# Patient Record
Sex: Male | Born: 1960 | Race: White | Hispanic: No | Marital: Married | Smoking: Current every day smoker
Health system: Southern US, Community
[De-identification: ages and names within clinical notes are randomized; demographics above are authoritative.]

## PROBLEM LIST (undated history)

## (undated) DIAGNOSIS — Z972 Presence of dental prosthetic device (complete) (partial): Secondary | ICD-10-CM

## (undated) DIAGNOSIS — M1990 Unspecified osteoarthritis, unspecified site: Secondary | ICD-10-CM

## (undated) DIAGNOSIS — S42309A Unspecified fracture of shaft of humerus, unspecified arm, initial encounter for closed fracture: Secondary | ICD-10-CM

## (undated) HISTORY — PX: MULTIPLE TOOTH EXTRACTIONS: SHX2053

## (undated) HISTORY — PX: HIP SURGERY: SHX245

---

## 2000-07-13 ENCOUNTER — Encounter: Payer: Self-pay | Admitting: Family Medicine

## 2000-07-13 ENCOUNTER — Encounter: Admission: RE | Admit: 2000-07-13 | Discharge: 2000-07-13 | Payer: Self-pay | Admitting: Family Medicine

## 2002-07-23 ENCOUNTER — Emergency Department (HOSPITAL_COMMUNITY): Admission: EM | Admit: 2002-07-23 | Discharge: 2002-07-23 | Payer: Self-pay

## 2010-09-22 ENCOUNTER — Emergency Department (HOSPITAL_COMMUNITY): Payer: Self-pay

## 2010-09-22 ENCOUNTER — Emergency Department (HOSPITAL_COMMUNITY)
Admission: EM | Admit: 2010-09-22 | Discharge: 2010-09-22 | Disposition: A | Discharge status: Home / Self Care | Payer: Self-pay | Attending: Emergency Medicine | Admitting: Emergency Medicine

## 2010-09-22 DIAGNOSIS — M79609 Pain in unspecified limb: Secondary | ICD-10-CM | POA: Insufficient documentation

## 2010-09-22 DIAGNOSIS — R0789 Other chest pain: Secondary | ICD-10-CM | POA: Insufficient documentation

## 2010-09-22 DIAGNOSIS — F17200 Nicotine dependence, unspecified, uncomplicated: Secondary | ICD-10-CM | POA: Insufficient documentation

## 2010-09-22 DIAGNOSIS — R0602 Shortness of breath: Secondary | ICD-10-CM | POA: Insufficient documentation

## 2010-09-22 LAB — POCT CARDIAC MARKERS
CKMB, poc: 1.2 ng/mL (ref 1.0–8.0)
Myoglobin, poc: 67.5 ng/mL (ref 12–200)

## 2010-09-22 LAB — POCT I-STAT, CHEM 8
BUN: 13 mg/dL (ref 6–23)
Calcium, Ion: 1.13 mmol/L (ref 1.12–1.32)
Chloride: 105 mEq/L (ref 96–112)
Creatinine, Ser: 1.1 mg/dL (ref 0.4–1.5)
Glucose, Bld: 118 mg/dL — ABNORMAL HIGH (ref 70–99)
HCT: 40 % (ref 39.0–52.0)
Hemoglobin: 13.6 g/dL (ref 13.0–17.0)
Potassium: 3.9 mEq/L (ref 3.5–5.1)
Sodium: 140 mEq/L (ref 135–145)
TCO2: 24 mmol/L (ref 0–100)

## 2010-09-22 LAB — DIFFERENTIAL
Basophils Absolute: 0 10*3/uL (ref 0.0–0.1)
Basophils Relative: 0 % (ref 0–1)
Eosinophils Absolute: 0.2 10*3/uL (ref 0.0–0.7)
Eosinophils Relative: 3 % (ref 0–5)
Lymphocytes Relative: 29 % (ref 12–46)
Lymphs Abs: 2.4 10*3/uL (ref 0.7–4.0)
Monocytes Absolute: 0.6 10*3/uL (ref 0.1–1.0)
Monocytes Relative: 7 % (ref 3–12)
Neutro Abs: 4.9 10*3/uL (ref 1.7–7.7)
Neutrophils Relative %: 61 % (ref 43–77)

## 2010-09-22 LAB — CBC
HCT: 38.1 % — ABNORMAL LOW (ref 39.0–52.0)
Hemoglobin: 13.4 g/dL (ref 13.0–17.0)
MCH: 31.5 pg (ref 26.0–34.0)
MCHC: 35.2 g/dL (ref 30.0–36.0)
MCV: 89.4 fL (ref 78.0–100.0)
Platelets: 274 10*3/uL (ref 150–400)
RBC: 4.26 MIL/uL (ref 4.22–5.81)
RDW: 13.3 % (ref 11.5–15.5)
WBC: 8.1 10*3/uL (ref 4.0–10.5)

## 2012-12-24 ENCOUNTER — Encounter (HOSPITAL_COMMUNITY): Payer: Self-pay | Admitting: Emergency Medicine

## 2012-12-24 ENCOUNTER — Emergency Department (HOSPITAL_COMMUNITY)
Admission: EM | Admit: 2012-12-24 | Discharge: 2012-12-24 | Disposition: A | Discharge status: Home / Self Care | Payer: Self-pay | Attending: Emergency Medicine | Admitting: Emergency Medicine

## 2012-12-24 DIAGNOSIS — F1410 Cocaine abuse, uncomplicated: Secondary | ICD-10-CM | POA: Insufficient documentation

## 2012-12-24 DIAGNOSIS — F1010 Alcohol abuse, uncomplicated: Secondary | ICD-10-CM | POA: Insufficient documentation

## 2012-12-24 DIAGNOSIS — F17200 Nicotine dependence, unspecified, uncomplicated: Secondary | ICD-10-CM | POA: Insufficient documentation

## 2012-12-24 LAB — CBC WITH DIFFERENTIAL/PLATELET
Basophils Absolute: 0 10*3/uL (ref 0.0–0.1)
Basophils Relative: 0 % (ref 0–1)
Eosinophils Absolute: 0.1 10*3/uL (ref 0.0–0.7)
Eosinophils Relative: 1 % (ref 0–5)
Lymphs Abs: 2.3 10*3/uL (ref 0.7–4.0)
MCH: 31.7 pg (ref 26.0–34.0)
MCHC: 35.7 g/dL (ref 30.0–36.0)
Neutrophils Relative %: 68 % (ref 43–77)
Platelets: 281 10*3/uL (ref 150–400)
RBC: 4.51 MIL/uL (ref 4.22–5.81)
RDW: 13.4 % (ref 11.5–15.5)

## 2012-12-24 LAB — ETHANOL: Alcohol, Ethyl (B): <11 mg/dL (ref 0–11)

## 2012-12-24 LAB — COMPREHENSIVE METABOLIC PANEL
ALT: 13 U/L (ref 0–53)
AST: 17 U/L (ref 0–37)
Albumin: 3.6 g/dL (ref 3.5–5.2)
Alkaline Phosphatase: 98 U/L (ref 39–117)
Calcium: 9.5 mg/dL (ref 8.4–10.5)
Potassium: 4 mEq/L (ref 3.5–5.1)
Sodium: 139 mEq/L (ref 135–145)
Total Protein: 6.8 g/dL (ref 6.0–8.3)

## 2012-12-24 LAB — RAPID URINE DRUG SCREEN, HOSP PERFORMED
Amphetamines: NOT DETECTED
Barbiturates: NOT DETECTED
Benzodiazepines: NOT DETECTED
Cocaine: POSITIVE — AB

## 2012-12-24 NOTE — ED Notes (Signed)
Pt states that he has been using for about 4 years on and off. Pt states he used last week and yesterday. Pt states it has never been this bad before. Pt states he wants help for this. NAD noted at this time. Pt alert and mentating appropriately at this time.

## 2012-12-24 NOTE — ED Notes (Signed)
PATIENT HAS CALLED [facility] TO CHECK ON TREATMENT. STATES HE HAS AN APPOINTMENT ON Monday AT 8 AM

## 2012-12-24 NOTE — ED Notes (Signed)
Patient has received discharge papers. He is going to eat lunch before he goes home

## 2012-12-24 NOTE — ED Notes (Signed)
Act team has spoken to dr Judd Lien and plan is to discharge pt home with referrals.

## 2012-12-24 NOTE — ED Notes (Addendum)
Patient states he has been using off and on for about 2 years. States he has been using weekly about 3-4 months. States he uses around 300 dollars worth at a time. States he is finding he is craving it more and more. States he used last night from about 6 pm until around 11 pm. Denies other drug use. States he does not drink alcohol. States he is not suicidal but does feel depressed after he uses. Pt is calm and cooperative. Poor eye contact. Pt given a soda and graham crackers per his request

## 2012-12-24 NOTE — ED Notes (Signed)
teleact completed. Awaiting reccomendations to be published

## 2012-12-24 NOTE — ED Notes (Signed)
Tele act in process.

## 2012-12-24 NOTE — ED Notes (Signed)
Pt states "it is embarrassing, but crack cocaine." Pt states the last time he used was last night. Pt states "I need help somehow." Pt states a little bit of lightheadedness and dizziness. Pt states "a little bit of chest pain" pt states "not like that." Pt denies n/v/d. Pt alert and mentating appropriately.

## 2012-12-24 NOTE — ED Provider Notes (Addendum)
CSN: [id number]     Arrival date & time 12/24/12  7829 History     First MD Initiated Contact with Patient 12/24/12 0730     Chief Complaint  Patient presents with  . Drug Problem  . Medical Clearance   (Consider location/radiation/quality/duration/timing/severity/associated sxs/prior Treatment) HPI Comments: This patient presents with complaints of crack use and is requesting help. He tells me that he has been using once weekly for the past 4 years. He states that he has been using more frequently lately and is requesting help. He denies he has ever been in any sort of drug treatment program before.  He does not report any chest pain or shortness of breath. He denies that he is a regular alcohol user.  Patient is a 52 y.o. male presenting with drug problem. The history is provided by the patient.  Drug Problem This is a new problem. Episode onset: 4 years ago. The problem occurs constantly. The problem has been gradually worsening. Pertinent negatives include no chest pain and no shortness of breath. Nothing aggravates the symptoms. Nothing relieves the symptoms. He has tried nothing for the symptoms. The treatment provided no relief.    History reviewed. No pertinent past medical history. Past Surgical History  Procedure Laterality Date  . Hip surgery     History reviewed. No pertinent family history. History  Substance Use Topics  . Smoking status: Current Every Day Smoker -- 1.00 packs/day for 35 years    Types: Cigarettes  . Smokeless tobacco: Not on file  . Alcohol Use: No    Review of Systems  Respiratory: Negative for shortness of breath.   Cardiovascular: Negative for chest pain.  All other systems reviewed and are negative.    Allergies  Review of patient's allergies indicates no known allergies.  Home Medications  No current outpatient prescriptions on file. BP 144/91  Pulse 72  Temp(Src) 98.3 F (36.8 C) (Oral)  Resp 20  SpO2 96% Physical Exam  Nursing  note and vitals reviewed. Constitutional: He is oriented to person, place, and time. He appears well-developed and well-nourished. No distress.  HENT:  Head: Normocephalic and atraumatic.  Mouth/Throat: Oropharynx is clear and moist.  Eyes: EOM are normal. Pupils are equal, round, and reactive to light.  Neck: Normal range of motion. Neck supple.  Cardiovascular: Normal rate, regular rhythm and normal heart sounds.   No murmur heard. Pulmonary/Chest: Effort normal and breath sounds normal. No respiratory distress. He has no wheezes.  Abdominal: Soft. Bowel sounds are normal. He exhibits no distension. There is no tenderness.  Musculoskeletal: Normal range of motion. He exhibits no edema.  Neurological: He is alert and oriented to person, place, and time. No cranial nerve deficit. He exhibits normal muscle tone. Coordination normal.  Skin: Skin is warm and dry. He is not diaphoretic.    ED Course   Procedures (including critical care time)  Labs Reviewed - No data to display No results found. No diagnosis found.   Date: 12/24/2012  Rate: 67  Rhythm: normal sinus rhythm  QRS Axis: normal  Intervals: normal  ST/T Wave abnormalities: normal  Conduction Disutrbances:none  Narrative Interpretation:   Old EKG Reviewed: unchanged    MDM  Medical workup was unremarkable including laboratory studies and EKG. The only abnormal finding is a positive test for cocaine. I have spoken with the act team who will discuss this with the patient. He will be placed on pod C while drug treatment options are discussed.  Patient cleared  by acting for discharge. He will be given outpatient followup information and is to return to the ER for any problems.  Geoffery Lyons, MD 12/24/12 1106  Geoffery Lyons, MD 12/24/12 1120

## 2012-12-24 NOTE — ED Notes (Signed)
Dr. Delo at bedside. 
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
## 2012-12-24 NOTE — ED Notes (Signed)
SPOKE WITH PAIGE (ACT TEAM) AND SHE ADVISES WILL BE A FEW MINUTES. SHE NEEDS TO SPEAK WITH THE DOCTOR BEFORE GIVING PLAN.

## 2016-05-23 ENCOUNTER — Emergency Department (HOSPITAL_COMMUNITY)
Admission: EM | Admit: 2016-05-23 | Discharge: 2016-05-23 | Disposition: A | Discharge status: Home / Self Care | Payer: 59 | Attending: Emergency Medicine | Admitting: Emergency Medicine

## 2016-05-23 ENCOUNTER — Encounter (HOSPITAL_COMMUNITY): Payer: Self-pay

## 2016-05-23 DIAGNOSIS — Z5321 Procedure and treatment not carried out due to patient leaving prior to being seen by health care provider: Secondary | ICD-10-CM | POA: Diagnosis not present

## 2016-05-23 DIAGNOSIS — R51 Headache: Secondary | ICD-10-CM | POA: Diagnosis not present

## 2016-05-23 NOTE — ED Notes (Signed)
Patient called 3x for lab draw, no answer. RN notified.

## 2016-05-23 NOTE — ED Notes (Signed)
Pt did not answer x 3.  It is assumed that the Pt left.

## 2016-05-23 NOTE — ED Triage Notes (Signed)
Pt c/o nasal congestion, cough, headache, and "a little" SOB x 3 days and diarrhea starting yesterday.  Pain score 7/10.  Pt reports taking OTC medications w/o relief.  NAD noted.  Pt easily speaking full sentences.

## 2016-05-23 NOTE — ED Notes (Signed)
Patient called 2x for blood draw, no answer. RN notified

## 2016-12-13 ENCOUNTER — Emergency Department (HOSPITAL_COMMUNITY)
Admission: EM | Admit: 2016-12-13 | Discharge: 2016-12-13 | Disposition: A | Discharge status: Home / Self Care | Payer: 59 | Attending: Emergency Medicine | Admitting: Emergency Medicine

## 2016-12-13 ENCOUNTER — Encounter (HOSPITAL_COMMUNITY): Payer: Self-pay | Admitting: *Deleted

## 2016-12-13 DIAGNOSIS — S0501XA Injury of conjunctiva and corneal abrasion without foreign body, right eye, initial encounter: Secondary | ICD-10-CM

## 2016-12-13 DIAGNOSIS — X58XXXA Exposure to other specified factors, initial encounter: Secondary | ICD-10-CM | POA: Insufficient documentation

## 2016-12-13 DIAGNOSIS — Y93H2 Activity, gardening and landscaping: Secondary | ICD-10-CM | POA: Diagnosis not present

## 2016-12-13 DIAGNOSIS — Y999 Unspecified external cause status: Secondary | ICD-10-CM | POA: Insufficient documentation

## 2016-12-13 DIAGNOSIS — F17210 Nicotine dependence, cigarettes, uncomplicated: Secondary | ICD-10-CM | POA: Insufficient documentation

## 2016-12-13 DIAGNOSIS — Y929 Unspecified place or not applicable: Secondary | ICD-10-CM | POA: Insufficient documentation

## 2016-12-13 DIAGNOSIS — S0591XA Unspecified injury of right eye and orbit, initial encounter: Secondary | ICD-10-CM | POA: Diagnosis present

## 2016-12-13 MED ORDER — IBUPROFEN 600 MG PO TABS: 600.0000 mg | ORAL_TABLET | Freq: Four times a day (QID) | ORAL | 0 refills | Status: DC | PRN

## 2016-12-13 MED ORDER — TETRACAINE HCL 0.5 % OP SOLN
2.0000 [drp] | Freq: Once | OPHTHALMIC | Status: AC
  Administered 2016-12-13: 2 [drp] via OPHTHALMIC
  Filled 2016-12-13: qty 4

## 2016-12-13 MED ORDER — FLUORESCEIN SODIUM 0.6 MG OP STRP
1.0000 | ORAL_STRIP | Freq: Once | OPHTHALMIC | Status: AC
  Administered 2016-12-13: 1 via OPHTHALMIC
  Filled 2016-12-13: qty 1

## 2016-12-13 MED ORDER — ERYTHROMYCIN 5 MG/GM OP OINT: TOPICAL_OINTMENT | OPHTHALMIC | 0 refills | Status: DC

## 2016-12-13 MED ORDER — HYDROCODONE-ACETAMINOPHEN 5-325 MG PO TABS: 1.0000 | ORAL_TABLET | Freq: Four times a day (QID) | ORAL | 0 refills | Status: DC | PRN

## 2016-12-13 NOTE — ED Provider Notes (Signed)
[facility]-EMERGENCY DEPT Provider Note   CSN: [id number] Arrival date & time: 12/13/16  1011  By signing my name below, I, Rosana Fretana Waskiewicz, attest that this documentation has been prepared under the direction and in the presence of non-physician practitioner, Gina Leblond, PA-C. Electronically Signed: Rosana Fretana Waskiewicz, ED Scribe. 12/13/16. 1:18 PM.  History   Chief Complaint Chief Complaint  Patient presents with  . Eye Injury   The history is provided by the patient. No language interpreter was used.   HPI Comments: Maurice Swanson is a 56 y.o. male who presents to the Emergency Department complaining of sudden onset, moderate right eye pain onset yesterday. Pt states that he was weeding outside 2 days ago with safety glasses. Pt reports associated drainage from the eye and photophobia. No contact lens use. No treatments tried at home. No hx of similar symptoms. Pt denies blurry vision or any other complaints at this time.  History reviewed. No pertinent past medical history.  There are no active problems to display for this patient.   Past Surgical History:  Procedure Laterality Date  . HIP SURGERY         Home Medications    Prior to Admission medications   Medication Sig Start Date End Date Taking? Authorizing Provider  erythromycin ophthalmic ointment Place a 1/2 inch ribbon of ointment into the lower eyelid 4 times daily. 12/13/16   Kamaiyah Uselton, Waylan BogaAlexandra M, PA-C  HYDROcodone-acetaminophen (NORCO/VICODIN) 5-325 MG tablet Take 1-2 tablets by mouth every 6 (six) hours as needed for severe pain. 12/13/16   Chrisa Hassan, Waylan BogaAlexandra M, PA-C  ibuprofen (ADVIL,MOTRIN) 600 MG tablet Take 1 tablet (600 mg total) by mouth every 6 (six) hours as needed. 12/13/16   Emi HolesLaw, Dong Nimmons M, PA-C    Family History No family history on file.  Social History Social History  Substance Use Topics  . Smoking status: Current Every Day Smoker    Packs/day: 0.00    Years: 35.00    Types: Cigarettes  . Smokeless  tobacco: Never Used  . Alcohol use No     Allergies   Patient has no known allergies.   Review of Systems Review of Systems  Constitutional: Negative for fever.  Eyes: Positive for photophobia, pain and discharge. Negative for visual disturbance.     Physical Exam Updated Vital Signs BP (!) 139/101 (BP Location: Right Arm)   Pulse (!) 56   Temp (!) 97.4 F (36.3 C) (Oral)   Resp 18   SpO2 99%   Physical Exam  Constitutional: He appears well-developed and well-nourished. No distress.  HENT:  Head: Normocephalic and atraumatic.  Mouth/Throat: Oropharynx is clear and moist. No oropharyngeal exudate.  Eyes: Pupils are equal, round, and reactive to light. Conjunctivae are normal. Right eye exhibits no discharge. Left eye exhibits no discharge. No scleral icterus.  2 pinpoint areas of uptake in the center of the cornea of the right eye. No other areas of uptake. Mild consensual photophobia and photophobia in the right eye. EOMs are intact without pain. Eye pressure average 12 in the right eye.   Visual Acuity  Right Eye Distance: 20/20 without corrective lens Left Eye Distance: 20/20 without corrective lens Bilateral Distance: 20/20 without corrective lens  Neck: Normal range of motion. Neck supple. No thyromegaly present.  Cardiovascular: Normal rate, regular rhythm, normal heart sounds and intact distal pulses.  Exam reveals no gallop and no friction rub.   No murmur heard. Pulmonary/Chest: Effort normal and breath sounds normal. No stridor. No respiratory distress.  He has no wheezes. He has no rales.  Abdominal: Soft. Bowel sounds are normal. He exhibits no distension. There is no tenderness. There is no rebound and no guarding.  Musculoskeletal: He exhibits no edema.  Lymphadenopathy:    He has no cervical adenopathy.  Neurological: He is alert. Coordination normal.  Skin: Skin is warm and dry. No rash noted. He is not diaphoretic. No pallor.  Psychiatric: He has a  normal mood and affect.  Nursing note and vitals reviewed.    ED Treatments / Results  DIAGNOSTIC STUDIES: Oxygen Saturation is 99% on RA, normal by my interpretation.   COORDINATION OF CARE: 1:18 PM-Discussed next steps with pt including follow up with an eye doctor. Pt verbalized understanding and is agreeable with the plan.   Labs (all labs ordered are listed, but only abnormal results are displayed) Labs Reviewed - No data to display  EKG  EKG Interpretation None       Radiology No results found.  Procedures Procedures (including critical care time)  Medications Ordered in ED Medications  fluorescein ophthalmic strip 1 strip (1 strip Right Eye Given 12/13/16 1254)  tetracaine (PONTOCAINE) 0.5 % ophthalmic solution 2 drop (2 drops Right Eye Given 12/13/16 1254)     Initial Impression / Assessment and Plan / ED Course  I have reviewed the triage vital signs and the nursing notes.  Pertinent labs & imaging results that were available during my care of the patient were reviewed by me and considered in my medical decision making (see chart for details).    Pt with corneal abrasion on exam. No evidence of FB.  Exam not concerning for orbital cellulitis, hyphema. No concern for uveitis. Patient will be discharged home with erythromycin, ibuprofen, short course of Norco. I reviewed the [place] narcotic database and found no discrepancies.  Patient understands to follow up with ophthalmology; return precautions discussed. Patient appears safe for discharged.   Final Clinical Impressions(s) / ED Diagnoses   Final diagnoses:  Abrasion of right cornea, initial encounter    New Prescriptions Discharge Medication List as of 12/13/2016  1:49 PM    START taking these medications   Details  erythromycin ophthalmic ointment Place a 1/2 inch ribbon of ointment into the lower eyelid 4 times daily., Print       I personally performed the services described in this documentation,  which was scribed in my presence. The recorded information has been reviewed and is accurate.     Emi HolesLaw, Arnice Vanepps M, PA-C 12/13/16 1447    Raeford RazorKohut, Stephen, MD 12/18/16 [phone]

## 2016-12-13 NOTE — ED Triage Notes (Signed)
Pt here with right eye irritation, redness, tearing, and pain. Feels like something in his eye since yesterday

## 2016-12-13 NOTE — Discharge Instructions (Signed)
Medications: erythromycin ointment, ibuprofen, Norco  Treatment: Apply erythromycin ointment 4 times daily in your right eye.take ibuprofen for mild to moderate pain every 6 hours. For severe pain, take 1-2 Norco every 4-6 hours.  Do not drink alcohol, drive, operate machinery or participate in any other potentially dangerous activities while taking opiate pain medication as it may make you sleepy. Do not take this medication with any other sedating medications, either prescription or over-the-counter. If you were prescribed Percocet or Vicodin, do not take these with acetaminophen (Tylenol) as it is already contained within these medications and overdose of Tylenol is dangerous.   This medication is an opiate (or narcotic) pain medication and can be habit forming.  Use it as little as possible to achieve adequate pain control.  Do not use or use it with extreme caution if you have a history of opiate abuse or dependence. This medication is intended for your use only - do not give any to anyone else and keep it in a secure place where nobody else, especially children, have access to it. It will also cause or worsen constipation, so you may want to consider taking an over-the-counter stool softener while you are taking this medication.   Follow-up: Please call the ophthalmologist, Dr. Dione BoozeGroat, today and make an appointment as soon as possible within the next 48 hours. Please return to the emergency department if you develop any new or worsening symptoms. You can establish care with a primary care provider by calling the number circled on your discharge paperwork, outlined in black.

## 2018-09-02 ENCOUNTER — Emergency Department (HOSPITAL_COMMUNITY)
Admission: EM | Admit: 2018-09-02 | Discharge: 2018-09-02 | Disposition: A | Discharge status: Home / Self Care | Payer: 59 | Attending: Emergency Medicine | Admitting: Emergency Medicine

## 2018-09-02 ENCOUNTER — Emergency Department (HOSPITAL_COMMUNITY): Payer: 59

## 2018-09-02 ENCOUNTER — Encounter (HOSPITAL_COMMUNITY): Payer: Self-pay

## 2018-09-02 ENCOUNTER — Other Ambulatory Visit: Payer: Self-pay

## 2018-09-02 DIAGNOSIS — F17210 Nicotine dependence, cigarettes, uncomplicated: Secondary | ICD-10-CM | POA: Diagnosis not present

## 2018-09-02 DIAGNOSIS — S4992XA Unspecified injury of left shoulder and upper arm, initial encounter: Secondary | ICD-10-CM | POA: Diagnosis present

## 2018-09-02 DIAGNOSIS — Y92018 Other place in single-family (private) house as the place of occurrence of the external cause: Secondary | ICD-10-CM | POA: Diagnosis not present

## 2018-09-02 DIAGNOSIS — Y999 Unspecified external cause status: Secondary | ICD-10-CM | POA: Insufficient documentation

## 2018-09-02 DIAGNOSIS — Z23 Encounter for immunization: Secondary | ICD-10-CM | POA: Diagnosis not present

## 2018-09-02 DIAGNOSIS — S42352A Displaced comminuted fracture of shaft of humerus, left arm, initial encounter for closed fracture: Secondary | ICD-10-CM | POA: Insufficient documentation

## 2018-09-02 DIAGNOSIS — Y9389 Activity, other specified: Secondary | ICD-10-CM | POA: Diagnosis not present

## 2018-09-02 MED ORDER — TETANUS-DIPHTH-ACELL PERTUSSIS 5-2.5-18.5 LF-MCG/0.5 IM SUSP
0.5000 mL | Freq: Once | INTRAMUSCULAR | Status: AC
  Administered 2018-09-02: 0.5 mL via INTRAMUSCULAR
  Filled 2018-09-02: qty 0.5

## 2018-09-02 MED ORDER — HYDROMORPHONE HCL 1 MG/ML IJ SOLN
1.0000 mg | Freq: Once | INTRAMUSCULAR | Status: AC
  Administered 2018-09-02: 18:00:00 1 mg via INTRAVENOUS

## 2018-09-02 MED ORDER — OXYCODONE-ACETAMINOPHEN 5-325 MG PO TABS
1.0000 | ORAL_TABLET | Freq: Once | ORAL | Status: AC
  Administered 2018-09-02: 21:00:00 1 via ORAL
  Filled 2018-09-02: qty 1

## 2018-09-02 MED ORDER — OXYCODONE-ACETAMINOPHEN 5-325 MG PO TABS: 1.0000 | ORAL_TABLET | Freq: Four times a day (QID) | ORAL | 0 refills | Status: DC | PRN
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
## 2018-09-02 NOTE — ED Notes (Signed)
Patient verbalizes understanding of discharge instructions. Opportunity for questioning and answers were provided. Armband removed by staff, pt discharged from ED.  

## 2018-09-02 NOTE — ED Notes (Signed)
Arm left out of sling until decided on if it needs splinting first.
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
## 2018-09-02 NOTE — ED Notes (Signed)
Cleansed wounds on bilateral arms (left forearm and right wrist) with wound cleanser, applied bacitracin, non-stick pads wrapped in kerlix.
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
## 2018-09-02 NOTE — ED Notes (Signed)
ED Provider at bedside. 

## 2018-09-28 NOTE — Pre-Procedure Instructions (Signed)
   Maurice Swanson  09/28/2018     [id number] [name] [name]  [address] Phone: [phone] Fax: [phone]   Your procedure is scheduled on Tuesday, Oct 02, 2018  Report to [facility] Admitting at 8:00 A.M.  Call this number if you have problems the morning of surgery:  [phone]   Remember: Brush your teeth the morning of surgery with your regular toothpaste.   Do not eat or drink after midnight.  Take these medicines the morning of surgery with A SIP OF WATER : If needed: Oxycodone-acetaminophen (PERCOCET)  for pain  Stop taking Aspirin (unless otherwise advised by surgeon), vitamins, fish oil and herbal medications. Do not take any NSAIDs ie: Ibuprofen, Advil, Naproxen (Aleve), Motrin, BC and Goody Powder.   Do not wear jewelry, make-up or nail polish.  Do not wear lotions, powders, or perfumes, or deodorant.  Do not shave 48 hours prior to surgery.  Men may shave face and neck.  Do not bring valuables to the hospital.  [facility] is not responsible for any belongings or valuables.  Contacts, dentures or bridgework may not be worn into surgery.   Patients discharged the day of surgery will not be allowed to drive home.  Special instructions: See " [place]-Preparing For Surgery " sheet. Please read over the following fact sheets that you were given. Pain Booklet, Coughing and Deep Breathing and Surgical Site Infection Prevention

## 2018-10-01 ENCOUNTER — Encounter (HOSPITAL_COMMUNITY)
Admission: RE | Admit: 2018-10-01 | Discharge: 2018-10-01 | Disposition: A | Discharge status: Home / Self Care | Payer: 59 | Source: Ambulatory Visit | Attending: Orthopedic Surgery | Admitting: Orthopedic Surgery

## 2018-10-01 ENCOUNTER — Encounter (HOSPITAL_COMMUNITY): Payer: Self-pay

## 2018-10-01 ENCOUNTER — Other Ambulatory Visit (HOSPITAL_COMMUNITY)
Admission: RE | Admit: 2018-10-01 | Discharge: 2018-10-01 | Disposition: A | Discharge status: Home / Self Care | Payer: 59 | Source: Ambulatory Visit | Attending: Orthopedic Surgery | Admitting: Orthopedic Surgery

## 2018-10-01 ENCOUNTER — Other Ambulatory Visit: Payer: Self-pay

## 2018-10-01 DIAGNOSIS — X58XXXA Exposure to other specified factors, initial encounter: Secondary | ICD-10-CM | POA: Diagnosis not present

## 2018-10-01 DIAGNOSIS — Z1159 Encounter for screening for other viral diseases: Secondary | ICD-10-CM | POA: Diagnosis not present

## 2018-10-01 DIAGNOSIS — M24522 Contracture, left elbow: Secondary | ICD-10-CM | POA: Diagnosis not present

## 2018-10-01 DIAGNOSIS — S42332A Displaced oblique fracture of shaft of humerus, left arm, initial encounter for closed fracture: Secondary | ICD-10-CM | POA: Diagnosis not present

## 2018-10-01 DIAGNOSIS — Z01812 Encounter for preprocedural laboratory examination: Secondary | ICD-10-CM | POA: Insufficient documentation

## 2018-10-01 DIAGNOSIS — S42302A Unspecified fracture of shaft of humerus, left arm, initial encounter for closed fracture: Secondary | ICD-10-CM | POA: Diagnosis present

## 2018-10-01 DIAGNOSIS — M1990 Unspecified osteoarthritis, unspecified site: Secondary | ICD-10-CM | POA: Diagnosis not present

## 2018-10-01 DIAGNOSIS — F17210 Nicotine dependence, cigarettes, uncomplicated: Secondary | ICD-10-CM | POA: Diagnosis not present

## 2018-10-01 DIAGNOSIS — M7502 Adhesive capsulitis of left shoulder: Secondary | ICD-10-CM | POA: Diagnosis not present

## 2018-10-01 HISTORY — DX: Unspecified osteoarthritis, unspecified site: M19.90

## 2018-10-01 HISTORY — DX: Presence of dental prosthetic device (complete) (partial): Z97.2

## 2018-10-01 HISTORY — DX: Unspecified fracture of shaft of humerus, unspecified arm, initial encounter for closed fracture: S42.309A

## 2018-10-01 LAB — CBC
HCT: 41.3 % (ref 39.0–52.0)
Hemoglobin: 13.4 g/dL (ref 13.0–17.0)
MCH: 30.2 pg (ref 26.0–34.0)
MCHC: 32.4 g/dL (ref 30.0–36.0)
MCV: 93.2 fL (ref 80.0–100.0)
Platelets: 276 10*3/uL (ref 150–400)
RBC: 4.43 MIL/uL (ref 4.22–5.81)
RDW: 13.2 % (ref 11.5–15.5)
WBC: 6.4 10*3/uL (ref 4.0–10.5)
nRBC: 0 % (ref 0.0–0.2)

## 2018-10-01 LAB — SARS CORONAVIRUS 2 BY RT PCR (HOSPITAL ORDER, PERFORMED IN ~~LOC~~ HOSPITAL LAB): SARS Coronavirus 2: NEGATIVE

## 2018-10-01 MED ORDER — TRANEXAMIC ACID-NACL 1000-0.7 MG/100ML-% IV SOLN
1000.0000 mg | INTRAVENOUS | Status: DC
  Filled 2018-10-01: qty 100

## 2018-10-01 NOTE — Pre-Procedure Instructions (Signed)
   Maurice Swanson  10/01/2018     [id number] [name] [name] [address] Phone: [phone] Fax: [phone]   Your procedure is scheduled on Tuesday, Oct 02, 2018  Report to [facility] Admitting at 8:00 A.M.  Call this number if you have problems the morning of surgery:  [phone]   Remember: Brush your teeth the morning of surgery with your regular toothpaste.  Do not eat after midnight.  You may drink clear liquids until 4:30 A.M. . Clear liquids allowed are: Water, Juice (non-citric and without pulp), Carbonated beverages, Clear Tea, Black Coffee only, Plain Jell-O only, Gatorade and Plain Popsicles only Please complete the Ensure Pre-Surgery carbohydrate drink by 4:30A.M.   Take these medicines the morning of surgery with A SIP OF WATER:  If needed: Oxycodone-acetaminophen (PERCOCET)  for pain Stop taking Aspirin (unless otherwise advised by surgeon), vitamins, fish oil and herbal medications. Do not take any NSAIDs ie: Ibuprofen, Advil, Naproxen (Aleve), Motrin, BC and Goody Powder.   Do not wear jewelry, make-up or nail polish.  Do not wear lotions, powders, or perfumes, or deodorant.  Do not shave 48 hours prior to surgery.  Men may shave face and neck.  Do not bring valuables to the hospital.  [facility] is not responsible for any belongings or valuables.  Contacts, dentures or bridgework may not be worn into surgery.   Patients discharged the day of surgery will not be allowed to drive home. Special instructions: See " [place]-Preparing For Surgery " sheet. Please read over the following fact sheets that you were given. Pain Booklet, Coughing and Deep Breathing and Surgical Site Infection Prevention

## 2018-10-01 NOTE — Progress Notes (Signed)
Pt denies SOB, chest pain, and being under the care of a cardiologist. Pt denies having a stress test, echo and cardiac cath. Pt denies having an EKG and chest x ray within the last year. Pt denies recent labs.   Pt advised to quarantine after COVID-19 test today.   Pt denies that he and family members have experienced the following symptoms:  Cough yes/no: No Fever (>100.58F)  yes/no: No Runny nose yes/no: No Sore throat yes/no: No Difficulty breathing/shortness of breath  yes/no: No  Have you or a family member traveled in the last 14 days and where? yes/no: No   Pt reminded that hospital visitation restrictions are in effect and the importance of the restrictions.   Pt verbalized understanding of all pre-op instructions.

## 2018-10-02 ENCOUNTER — Encounter (HOSPITAL_COMMUNITY): Payer: Self-pay

## 2018-10-02 ENCOUNTER — Ambulatory Visit (HOSPITAL_COMMUNITY): Payer: 59 | Admitting: Anesthesiology

## 2018-10-02 ENCOUNTER — Ambulatory Visit (HOSPITAL_COMMUNITY)
Admission: RE | Admit: 2018-10-02 | Discharge: 2018-10-02 | Disposition: A | Discharge status: Home / Self Care | Payer: 59 | Attending: Orthopedic Surgery | Admitting: Orthopedic Surgery

## 2018-10-02 ENCOUNTER — Ambulatory Visit (HOSPITAL_COMMUNITY): Payer: 59

## 2018-10-02 ENCOUNTER — Ambulatory Visit (HOSPITAL_COMMUNITY): Payer: 59 | Admitting: Physician Assistant

## 2018-10-02 ENCOUNTER — Encounter (HOSPITAL_COMMUNITY)
Admission: RE | Disposition: A | Discharge status: Home / Self Care | Payer: Self-pay | Source: Home / Self Care | Attending: Orthopedic Surgery

## 2018-10-02 DIAGNOSIS — F17210 Nicotine dependence, cigarettes, uncomplicated: Secondary | ICD-10-CM | POA: Insufficient documentation

## 2018-10-02 DIAGNOSIS — R52 Pain, unspecified: Secondary | ICD-10-CM

## 2018-10-02 DIAGNOSIS — M7502 Adhesive capsulitis of left shoulder: Secondary | ICD-10-CM | POA: Insufficient documentation

## 2018-10-02 DIAGNOSIS — S42332A Displaced oblique fracture of shaft of humerus, left arm, initial encounter for closed fracture: Secondary | ICD-10-CM | POA: Diagnosis present

## 2018-10-02 DIAGNOSIS — M24522 Contracture, left elbow: Secondary | ICD-10-CM | POA: Insufficient documentation

## 2018-10-02 DIAGNOSIS — Z1159 Encounter for screening for other viral diseases: Secondary | ICD-10-CM | POA: Insufficient documentation

## 2018-10-02 DIAGNOSIS — M1990 Unspecified osteoarthritis, unspecified site: Secondary | ICD-10-CM | POA: Insufficient documentation

## 2018-10-02 DIAGNOSIS — X58XXXA Exposure to other specified factors, initial encounter: Secondary | ICD-10-CM | POA: Insufficient documentation

## 2018-10-02 HISTORY — PX: SHOULDER CLOSED REDUCTION: SHX1051

## 2018-10-02 SURGERY — MANIPULATION, JOINT, SHOULDER, WITH ANESTHESIA
Anesthesia: General | Site: Arm Upper | Laterality: Left

## 2018-10-02 MED ORDER — LACTATED RINGERS IV SOLN
INTRAVENOUS | Status: DC
  Administered 2018-10-02: 10:00:00 via INTRAVENOUS

## 2018-10-02 MED ORDER — PROPOFOL 10 MG/ML IV BOLUS
INTRAVENOUS | Status: DC | PRN
  Administered 2018-10-02: 50 mg via INTRAVENOUS
  Administered 2018-10-02: 150 mg via INTRAVENOUS

## 2018-10-02 MED ORDER — FENTANYL CITRATE (PF) 250 MCG/5ML IJ SOLN
INTRAMUSCULAR | Status: AC
  Filled 2018-10-02: qty 5

## 2018-10-02 MED ORDER — MIDAZOLAM HCL 2 MG/2ML IJ SOLN
INTRAMUSCULAR | Status: DC | PRN
  Administered 2018-10-02: 2 mg via INTRAVENOUS

## 2018-10-02 MED ORDER — FENTANYL CITRATE (PF) 250 MCG/5ML IJ SOLN
INTRAMUSCULAR | Status: DC | PRN
  Administered 2018-10-02 (×3): 50 ug via INTRAVENOUS

## 2018-10-02 MED ORDER — OXYCODONE-ACETAMINOPHEN 5-325 MG PO TABS: 1.0000 | ORAL_TABLET | Freq: Four times a day (QID) | ORAL | 0 refills | Status: DC | PRN

## 2018-10-02 MED ORDER — CHLORHEXIDINE GLUCONATE 4 % EX LIQD: 60.0000 mL | Freq: Once | CUTANEOUS | Status: DC

## 2018-10-02 MED ORDER — SUCCINYLCHOLINE CHLORIDE 200 MG/10ML IV SOSY
PREFILLED_SYRINGE | INTRAVENOUS | Status: DC | PRN
  Administered 2018-10-02: 80 mg via INTRAVENOUS

## 2018-10-02 MED ORDER — MIDAZOLAM HCL 2 MG/2ML IJ SOLN
INTRAMUSCULAR | Status: AC
  Filled 2018-10-02: qty 2

## 2018-10-02 MED ORDER — ONDANSETRON HCL 4 MG/2ML IJ SOLN
INTRAMUSCULAR | Status: AC
  Filled 2018-10-02: qty 2

## 2018-10-02 MED ORDER — DEXAMETHASONE SODIUM PHOSPHATE 10 MG/ML IJ SOLN
INTRAMUSCULAR | Status: AC
  Filled 2018-10-02: qty 1

## 2018-10-02 MED ORDER — DEXAMETHASONE SODIUM PHOSPHATE 10 MG/ML IJ SOLN
INTRAMUSCULAR | Status: DC | PRN
  Administered 2018-10-02: 5 mg via INTRAVENOUS

## 2018-10-02 MED ORDER — PROPOFOL 10 MG/ML IV BOLUS
INTRAVENOUS | Status: AC
  Filled 2018-10-02: qty 20

## 2018-10-02 MED ORDER — SUCCINYLCHOLINE CHLORIDE 200 MG/10ML IV SOSY
PREFILLED_SYRINGE | INTRAVENOUS | Status: AC
  Filled 2018-10-02: qty 10

## 2018-10-02 MED ORDER — LIDOCAINE 2% (20 MG/ML) 5 ML SYRINGE
INTRAMUSCULAR | Status: AC
  Filled 2018-10-02: qty 5

## 2018-10-02 MED ORDER — CEFAZOLIN SODIUM-DEXTROSE 2-4 GM/100ML-% IV SOLN
2.0000 g | INTRAVENOUS | Status: AC
  Administered 2018-10-02: 2 g via INTRAVENOUS

## 2018-10-02 MED ORDER — LIDOCAINE 2% (20 MG/ML) 5 ML SYRINGE
INTRAMUSCULAR | Status: DC | PRN
  Administered 2018-10-02: 100 mg via INTRAVENOUS

## 2018-10-02 MED ORDER — EPHEDRINE 5 MG/ML INJ
INTRAVENOUS | Status: AC
  Filled 2018-10-02: qty 10

## 2018-10-02 MED ORDER — CEFAZOLIN SODIUM-DEXTROSE 2-4 GM/100ML-% IV SOLN
INTRAVENOUS | Status: AC
  Filled 2018-10-02: qty 100

## 2018-10-02 MED ORDER — EPHEDRINE SULFATE-NACL 50-0.9 MG/10ML-% IV SOSY
PREFILLED_SYRINGE | INTRAVENOUS | Status: DC | PRN
  Administered 2018-10-02 (×2): 10 mg via INTRAVENOUS

## 2018-10-02 MED ORDER — ONDANSETRON HCL 4 MG/2ML IJ SOLN
INTRAMUSCULAR | Status: DC | PRN
  Administered 2018-10-02: 4 mg via INTRAVENOUS

## 2018-10-02 SURGICAL SUPPLY — 43 items
CLOSURE WOUND 1/2 X4 (GAUZE/BANDAGES/DRESSINGS)
COVER SURGICAL LIGHT HANDLE (MISCELLANEOUS) ×4 IMPLANT
COVER WAND RF STERILE (DRAPES) ×4 IMPLANT
DRAPE INCISE IOBAN 66X45 STRL (DRAPES) ×4 IMPLANT
DRAPE ORTHO SPLIT 77X108 STRL (DRAPES) ×4
DRAPE SURG ORHT 6 SPLT 77X108 (DRAPES) ×4 IMPLANT
DRAPE U-SHAPE 47X51 STRL (DRAPES) ×4 IMPLANT
DRSG AQUACEL AG ADV 3.5X 4 (GAUZE/BANDAGES/DRESSINGS) IMPLANT
DRSG AQUACEL AG ADV 3.5X 6 (GAUZE/BANDAGES/DRESSINGS) IMPLANT
DRSG PAD ABDOMINAL 8X10 ST (GAUZE/BANDAGES/DRESSINGS) ×4 IMPLANT
DURAPREP 26ML APPLICATOR (WOUND CARE) ×4 IMPLANT
ELECT REM PT RETURN 9FT ADLT (ELECTROSURGICAL) ×4
ELECTRODE REM PT RTRN 9FT ADLT (ELECTROSURGICAL) ×2 IMPLANT
GLOVE BIO SURGEON STRL SZ7.5 (GLOVE) ×4 IMPLANT
GLOVE BIOGEL PI IND STRL 8 (GLOVE) ×2 IMPLANT
GLOVE BIOGEL PI INDICATOR 8 (GLOVE) ×2
GOWN STRL REUS W/ TWL LRG LVL3 (GOWN DISPOSABLE) ×2 IMPLANT
GOWN STRL REUS W/ TWL XL LVL3 (GOWN DISPOSABLE) ×2 IMPLANT
GOWN STRL REUS W/TWL LRG LVL3 (GOWN DISPOSABLE) ×2
GOWN STRL REUS W/TWL XL LVL3 (GOWN DISPOSABLE) ×2
KIT BASIN OR (CUSTOM PROCEDURE TRAY) ×4 IMPLANT
KIT TURNOVER KIT B (KITS) ×4 IMPLANT
MANIFOLD NEPTUNE II (INSTRUMENTS) ×4 IMPLANT
NS IRRIG 1000ML POUR BTL (IV SOLUTION) ×4 IMPLANT
PACK SHOULDER (CUSTOM PROCEDURE TRAY) ×4 IMPLANT
PAD ARMBOARD 7.5X6 YLW CONV (MISCELLANEOUS) ×8 IMPLANT
SLING ARM FOAM STRAP LRG (SOFTGOODS) ×2 IMPLANT
SLING ARM FOAM STRAP MED (SOFTGOODS) IMPLANT
SPONGE LAP 18X18 RF (DISPOSABLE) IMPLANT
SPONGE LAP 4X18 RFD (DISPOSABLE) IMPLANT
STRIP CLOSURE SKIN 1/2X4 (GAUZE/BANDAGES/DRESSINGS) IMPLANT
SUCTION FRAZIER HANDLE 10FR (MISCELLANEOUS) ×2
SUCTION TUBE FRAZIER 10FR DISP (MISCELLANEOUS) ×2 IMPLANT
SUT FIBERWIRE #2 38 T-5 BLUE (SUTURE)
SUT MNCRL AB 4-0 PS2 18 (SUTURE) IMPLANT
SUT VIC AB 2-0 CT1 27 (SUTURE) ×2
SUT VIC AB 2-0 CT1 TAPERPNT 27 (SUTURE) ×2 IMPLANT
SUTURE FIBERWR #2 38 T-5 BLUE (SUTURE) IMPLANT
SYR CONTROL 10ML LL (SYRINGE) IMPLANT
TOWEL OR 17X24 6PK STRL BLUE (TOWEL DISPOSABLE) ×4 IMPLANT
TOWEL OR 17X26 10 PK STRL BLUE (TOWEL DISPOSABLE) ×4 IMPLANT
WATER STERILE IRR 1000ML POUR (IV SOLUTION) ×4 IMPLANT
YANKAUER SUCT BULB TIP NO VENT (SUCTIONS) ×4 IMPLANT

## 2018-10-02 NOTE — Anesthesia Procedure Notes (Signed)
Procedure Name: Intubation Date/Time: 10/02/2018 10:14 AM Performed by: Mayer Camel, CRNA Pre-anesthesia Checklist: Patient identified, Emergency Drugs available, Suction available and Patient being monitored Patient Re-evaluated:Patient Re-evaluated prior to induction Oxygen Delivery Method: Circle System Utilized Preoxygenation: Pre-oxygenation with 100% oxygen Induction Type: IV induction and Rapid sequence Laryngoscope Size: Miller and 2 Grade View: Grade I Tube type: Oral Tube size: 7.5 mm Number of attempts: 1 Airway Equipment and Method: Stylet and Oral airway Placement Confirmation: ETT inserted through vocal cords under direct vision,  positive ETCO2 and breath sounds checked- equal and bilateral Secured at: 22 cm Tube secured with: Tape Dental Injury: Teeth and Oropharynx as per pre-operative assessment  Comments: Patient told myself and Dr. Clemens Catholic his partial was removed.  He did not mention dentures.  During induction it was noticed he had top dentures and lower partial still intact.  Upper dentures removed easily and placed in patient labeled denture cup, lower partial not able to removed without force.  Left in during induction and emergence.  No changes or issues noticed.

## 2018-10-02 NOTE — H&P (Signed)
ORTHOPAEDIC H and P  REQUESTING PHYSICIAN: Maurice Swanson,  Szumski, MD  PCP:  Maurice Swanson  Chief Complaint: Left humerus fracture  HPI: Maurice Swanson is a 58 y.o. male who complains of persistent pain and instability of his left humeral shaft fracture.  He has trailed non operative management now for 3 weeks, but due to failure of improvement in symptoms he presents today for ORIF.   No new complaints today.  Past Medical History:  Diagnosis Date  . Arthritis   . Humerus fracture    left  . Wears partial dentures    Past Surgical History:  Procedure Laterality Date  . HIP SURGERY    . MULTIPLE TOOTH EXTRACTIONS     Social History   Socioeconomic History  . Marital status: Married    Spouse name: Not on file  . Number of children: Not on file  . Years of education: Not on file  . Highest education level: Not on file  Occupational History  . Not on file  Social Needs  . Financial resource strain: Not on file  . Food insecurity:    Worry: Not on file    Inability: Not on file  . Transportation needs:    Medical: Not on file    Non-medical: Not on file  Tobacco Use  . Smoking status: Current Every Day Smoker    Packs/day: 0.50    Years: 35.00    Pack years: 17.50    Types: Cigarettes  . Smokeless tobacco: Never Used  Substance and Sexual Activity  . Alcohol use: No  . Drug use: No    Types: Cocaine    Comment: Denies 10/01/2018  . Sexual activity: Not on file  Lifestyle  . Physical activity:    Days Swanson week: Not on file    Minutes Swanson session: Not on file  . Stress: Not on file  Relationships  . Social connections:    Talks on phone: Not on file    Gets together: Not on file    Attends religious service: Not on file    Active member of club or organization: Not on file    Attends meetings of clubs or organizations: Not on file    Relationship status: Not on file  Other Topics Concern  . Not on file  Social History Narrative  . Not on  file   Family History  Problem Relation Age of Onset  . Hypertension Mother    No Known Allergies Prior to Admission medications   Medication Sig Start Date End Date Taking? Authorizing Provider  oxyCODONE-acetaminophen (PERCOCET) 5-325 MG tablet Take 1 tablet by mouth every 6 (six) hours as needed for moderate pain or severe pain. 09/02/18  Yes Fayrene Helperran, [place], PA-C   No results found.  Positive ROS: All other systems have been reviewed and were otherwise negative with the exception of those mentioned in the HPI and as above.  Physical Exam: General: Alert, no acute distress Cardiovascular: No pedal edema Respiratory: No cyanosis, no use of accessory musculature GI: No organomegaly, abdomen is soft and non-tender Skin: No lesions in the area of chief complaint Neurologic: Sensation intact distally Psychiatric: Patient is competent for consent with normal mood and affect Lymphatic: No axillary or cervical lymphadenopathy  MUSCULOSKELETAL:  Left arm.  sarmiento in place.  Tender along brachium.  Distally NVI.  No pain with passive stretch.  Assessment: 1.  Left humeral shaft fracture  Plan: - plan for ORIF today - The  risks, benefits, and alternatives were discussed with the patient. There are risks associated with the surgery including, but not limited to, problems with anesthesia (death), infection, differences in leg length/angulation/rotation, fracture of bones, loosening or failure of implants, malunion, nonunion, hematoma (blood accumulation) which may require surgical drainage, blood clots, pulmonary embolism, nerve injury, and blood vessel injury. The patient understands these risks and elects to proceed.  - he would like to dc home post op if able.       Maurice Kida, MD Cell [phone]    10/02/2018 9:29 AM

## 2018-10-02 NOTE — Discharge Instructions (Signed)
-  Sling for comfort to left arm - remove sling when able and begin ROM to elbow and shoulder - no lifting over 10 pounds  - for mild pain use tylenol and or advil with norco for breakthrough pain  - return to see Dr. Aundria Rud next week.

## 2018-10-02 NOTE — Anesthesia Preprocedure Evaluation (Addendum)
Anesthesia Evaluation  Patient identified by MRN, date of birth, ID band Patient awake    Reviewed: Allergy & Precautions, NPO status , Patient's Chart, lab work & pertinent test results  History of Anesthesia Complications Negative for: history of anesthetic complications  Airway Mallampati: II       Dental  (+) Partial Lower   Pulmonary Current Smoker,    Pulmonary exam normal        Cardiovascular negative cardio ROS Normal cardiovascular exam     Neuro/Psych negative neurological ROS  negative psych ROS   GI/Hepatic negative GI ROS, Neg liver ROS,   Endo/Other  negative endocrine ROS  Renal/GU negative Renal ROS  negative genitourinary   Musculoskeletal negative musculoskeletal ROS (+)   Abdominal   Peds  Hematology negative hematology ROS (+)   Anesthesia Other Findings   Reproductive/Obstetrics                         Anesthesia Physical Anesthesia Plan  ASA: II  Anesthesia Plan: General   Post-op Pain Management:    Induction: Intravenous and Rapid sequence  PONV Risk Score and Plan: 1 and Ondansetron, Dexamethasone, Midazolam and Treatment may vary due to age or medical condition  Airway Management Planned: Oral ETT  Additional Equipment: None  Intra-op Plan:   Post-operative Plan: Extubation in OR  Informed Consent: I have reviewed the patients History and Physical, chart, labs and discussed the procedure including the risks, benefits and alternatives for the proposed anesthesia with the patient or authorized representative who has indicated his/her understanding and acceptance.     Dental advisory given  Plan Discussed with:   Anesthesia Plan Comments:        Anesthesia Quick Evaluation

## 2018-10-02 NOTE — Transfer of Care (Signed)
Immediate Anesthesia Transfer of Care Note  Patient: Maurice Swanson  Procedure(s) Performed: manipulation of shoulder and elbow (Left Arm Upper)  Patient Location: PACU  Anesthesia Type:General  Level of Consciousness: awake, alert  and oriented  Airway & Oxygen Therapy: Patient Spontanous Breathing and Patient connected to face mask oxygen  Post-op Assessment: Report given to RN and Post -op Vital signs reviewed and stable  Post vital signs: Reviewed and stable  Last Vitals:  Vitals Value Taken Time  BP 106/73 10/02/2018 10:43 AM  Temp    Pulse 81 10/02/2018 10:49 AM  Resp 23 10/02/2018 10:49 AM  SpO2 95 % 10/02/2018 10:49 AM  Vitals shown include unvalidated device data.  Last Pain:  Vitals:   10/02/18 0823  TempSrc: Oral  PainSc: 0-No pain      Patients Stated Pain Goal: 3 (10/02/18 3785)  Complications: No apparent anesthesia complications

## 2018-10-02 NOTE — Brief Op Note (Signed)
10/02/2018  10:43 AM  PATIENT:  Garrison [place]  58 y.o. male  PRE-OPERATIVE DIAGNOSIS:  Left humerus fracture  POST-OPERATIVE DIAGNOSIS:  Left humerus fracture  PROCEDURE:  Procedure(s): manipulation of shoulder and elbow (Left)  SURGEON:  Surgeon(s) and Role:    * Yolonda Kida, MD - Primary  PHYSICIAN ASSISTANT:   ASSISTANTS: none   ANESTHESIA:   general  EBL:   0  BLOOD ADMINISTERED:none  DRAINS: none   LOCAL MEDICATIONS USED:  NONE  SPECIMEN:  No Specimen  DISPOSITION OF SPECIMEN:  N/A  COUNTS:  YES  TOURNIQUET:  * No tourniquets in log *  DICTATION: .Note written in EPIC  PLAN OF CARE: Discharge to home after PACU  PATIENT DISPOSITION:  PACU - hemodynamically stable.   Delay start of Pharmacological VTE agent (>24hrs) due to surgical blood loss or risk of bleeding: not applicable

## 2018-10-02 NOTE — Op Note (Signed)
Date of Surgery: 10/02/2018  INDICATIONS: Maurice Swanson is a 58 y.o.-year-old male with a left humerus fracture;  The patient did consent to the procedure after discussion of the risks and benefits.  PREOPERATIVE DIAGNOSIS: 1. Left long oblique humerus fracture 2. Left elbow flexion contracture 3. Left shoulder adhesive capsulitis  POSTOPERATIVE DIAGNOSIS: Same.  PROCEDURE:  1. Stress examination of left humerus under anesthesia 2.  Manipulation of left elbow under anesthesia 3. Manipulation of left shoulder under anesthesia  SURGEON: Maryan Rued, M.D.  ASSIST: none.  ANESTHESIA:  general  IV FLUIDS AND URINE: See anesthesia.  ESTIMATED BLOOD LOSS: 0 mL.  IMPLANTS: none  DRAINS: none  COMPLICATIONS: None.  DESCRIPTION OF PROCEDURE: The patient was brought to the operating room and placed supine on the operating table.  The patient had been signed prior to the procedure and this was documented. The patient had the anesthesia placed by the anesthesiologist.  A time-out was performed to confirm that this was the correct patient, site, side and location. The patient did receive antibiotics prior to the incision and was re-dosed during the procedure as needed at indicated intervals.  A tourniquet was not placed.    Following induction and prior to prep and drape, we performed an examination and stress examination of the left humerus.  Of note there was no gross motion noted through the fracture site that had previously been unstable in the office last week.  We then brought in fluoroscopy for dynamic examination and stress radiography of the left humerus.  On this exam there was noted to be some callus noted along the anterior aspect of the long oblique fracture.  Furthermore, there was no gross motion noted through the fracture site on stress examination.  This was a negative study.  At this point we determined that there was no indication for open reduction and internal  fixation.  Next we moved to perform a manipulation of the left elbow.  He was noted to have a 30 degree flexion contracture.  With gentle longitudinal traction and steady pressure in extension movement we were able to bring the elbow to 5 degrees short of full extension.  He had no blocks to flexion up to 140 degrees and had full supination to 80 degrees and pronation to 90 degrees.   Following the manipulation of the elbow, we moved to manipulate the left shoulder.  At the shoulder he likewise had developed capsulitis secondary to being immobilized in his brace.  He had firm endpoints noted at 90 degrees of abduction and 40 degrees of external rotation and 120 degrees of forward elevation.  Again with steady pressure we were able to move him into 160 degrees of forward elevation, 70 degrees of external rotation, and 120 degrees of abduction.   Following the above procedures we placed the arm in a simple sling.  He was awakened from general anesthesia with no apparent or noted complications.  He was transported to PACU in stable condition.  POSTOPERATIVE PLAN:  Maurice Swanson will be able to advance to a 5 pound lifting restriction for the left arm.  He can wear his sling for comfort only.  He will begin occupational therapy immediately to work on aggressive range of motion at the elbow and shoulder.  We will see him back in the office in 1 week for new x-rays.

## 2018-10-03 ENCOUNTER — Encounter (HOSPITAL_COMMUNITY): Payer: Self-pay | Admitting: Orthopedic Surgery

## 2018-10-03 NOTE — Anesthesia Postprocedure Evaluation (Signed)
Anesthesia Post Note  Patient: Maurice Swanson  Procedure(s) Performed: manipulation of shoulder and elbow (Left Arm Upper)     Patient location during evaluation: PACU Anesthesia Type: General Level of consciousness: awake and alert Pain management: pain level controlled Vital Signs Assessment: post-procedure vital signs reviewed and stable Respiratory status: spontaneous breathing, nonlabored ventilation and respiratory function stable Cardiovascular status: blood pressure returned to baseline and stable Postop Assessment: no apparent nausea or vomiting Anesthetic complications: no    Last Vitals:  Vitals:   10/02/18 1100 10/02/18 1115  BP: (!) 114/92 106/74  Pulse: 71 67  Resp: (!) 21 (!) 21  Temp:    SpO2: 92% 95%    Last Pain:  Vitals:   10/02/18 1115  TempSrc:   PainSc: 0-No pain   Pain Goal: Patients Stated Pain Goal: 3 (10/02/18 0823)                 Lucretia Kern

## 2018-11-10 ENCOUNTER — Emergency Department (HOSPITAL_COMMUNITY): Payer: 59 | Admitting: Anesthesiology

## 2018-11-10 ENCOUNTER — Emergency Department (HOSPITAL_COMMUNITY): Payer: 59

## 2018-11-10 ENCOUNTER — Observation Stay (HOSPITAL_COMMUNITY)
Admission: EM | Admit: 2018-11-10 | Discharge: 2018-11-11 | Disposition: A | Discharge status: Home / Self Care | Payer: 59 | Attending: Otolaryngology | Admitting: Otolaryngology

## 2018-11-10 ENCOUNTER — Encounter (HOSPITAL_COMMUNITY)
Admission: EM | Disposition: A | Discharge status: Home / Self Care | Payer: Self-pay | Source: Home / Self Care | Attending: Emergency Medicine

## 2018-11-10 ENCOUNTER — Other Ambulatory Visit: Payer: Self-pay

## 2018-11-10 ENCOUNTER — Encounter (HOSPITAL_COMMUNITY): Payer: Self-pay | Admitting: *Deleted

## 2018-11-10 DIAGNOSIS — Z1159 Encounter for screening for other viral diseases: Secondary | ICD-10-CM | POA: Diagnosis not present

## 2018-11-10 DIAGNOSIS — L0201 Cutaneous abscess of face: Secondary | ICD-10-CM | POA: Diagnosis not present

## 2018-11-10 DIAGNOSIS — L0211 Cutaneous abscess of neck: Secondary | ICD-10-CM | POA: Diagnosis present

## 2018-11-10 DIAGNOSIS — F17210 Nicotine dependence, cigarettes, uncomplicated: Secondary | ICD-10-CM | POA: Diagnosis not present

## 2018-11-10 HISTORY — PX: INCISION AND DRAINAGE ABSCESS: SHX5864

## 2018-11-10 LAB — CBC WITH DIFFERENTIAL/PLATELET
Abs Immature Granulocytes: 0.04 10*3/uL (ref 0.00–0.07)
Basophils Absolute: 0 10*3/uL (ref 0.0–0.1)
Basophils Relative: 0 %
Eosinophils Absolute: 0.1 10*3/uL (ref 0.0–0.5)
Eosinophils Relative: 1 %
HCT: 40.3 % (ref 39.0–52.0)
Hemoglobin: 13 g/dL (ref 13.0–17.0)
Immature Granulocytes: 0 %
Lymphocytes Relative: 19 %
Lymphs Abs: 2 10*3/uL (ref 0.7–4.0)
MCH: 30 pg (ref 26.0–34.0)
MCHC: 32.3 g/dL (ref 30.0–36.0)
MCV: 93.1 fL (ref 80.0–100.0)
Monocytes Absolute: 0.9 10*3/uL (ref 0.1–1.0)
Monocytes Relative: 8 %
Neutro Abs: 7.7 10*3/uL (ref 1.7–7.7)
Neutrophils Relative %: 72 %
Platelets: 377 10*3/uL (ref 150–400)
RBC: 4.33 MIL/uL (ref 4.22–5.81)
RDW: 12.8 % (ref 11.5–15.5)
WBC: 10.7 10*3/uL — ABNORMAL HIGH (ref 4.0–10.5)
nRBC: 0 % (ref 0.0–0.2)

## 2018-11-10 LAB — BASIC METABOLIC PANEL
Anion gap: 10 (ref 5–15)
BUN: 15 mg/dL (ref 6–20)
CO2: 26 mmol/L (ref 22–32)
Calcium: 9.3 mg/dL (ref 8.9–10.3)
Chloride: 101 mmol/L (ref 98–111)
Creatinine, Ser: 1.03 mg/dL (ref 0.61–1.24)
GFR calc Af Amer: >60 mL/min (ref >60)
GFR calc non Af Amer: >60 mL/min (ref >60)
Glucose, Bld: 101 mg/dL — ABNORMAL HIGH (ref 70–99)
Potassium: 4.6 mmol/L (ref 3.5–5.1)
Sodium: 137 mmol/L (ref 135–145)

## 2018-11-10 LAB — SARS CORONAVIRUS 2 BY RT PCR (HOSPITAL ORDER, PERFORMED IN ~~LOC~~ HOSPITAL LAB): SARS Coronavirus 2: NEGATIVE

## 2018-11-10 SURGERY — INCISION AND DRAINAGE, ABSCESS
Anesthesia: General

## 2018-11-10 MED ORDER — OXYCODONE HCL 5 MG PO TABS: 5.0000 mg | ORAL_TABLET | Freq: Once | ORAL | Status: DC | PRN

## 2018-11-10 MED ORDER — MORPHINE SULFATE (PF) 2 MG/ML IV SOLN: 2.0000 mg | INTRAVENOUS | Status: DC | PRN

## 2018-11-10 MED ORDER — OXYCODONE HCL 5 MG/5ML PO SOLN: 5.0000 mg | Freq: Once | ORAL | Status: DC | PRN

## 2018-11-10 MED ORDER — LIDOCAINE 2% (20 MG/ML) 5 ML SYRINGE
INTRAMUSCULAR | Status: DC | PRN
  Administered 2018-11-10: 60 mg via INTRAVENOUS

## 2018-11-10 MED ORDER — CLINDAMYCIN PHOSPHATE 600 MG/50ML IV SOLN
INTRAVENOUS | Status: AC
  Filled 2018-11-10: qty 50

## 2018-11-10 MED ORDER — CLINDAMYCIN PHOSPHATE 600 MG/50ML IV SOLN
600.0000 mg | Freq: Three times a day (TID) | INTRAVENOUS | Status: DC
  Administered 2018-11-10 – 2018-11-11 (×2): 600 mg via INTRAVENOUS
  Filled 2018-11-10 (×2): qty 50

## 2018-11-10 MED ORDER — SUCCINYLCHOLINE CHLORIDE 20 MG/ML IJ SOLN
INTRAMUSCULAR | Status: DC | PRN
  Administered 2018-11-10: 80 mg via INTRAVENOUS

## 2018-11-10 MED ORDER — MIDAZOLAM HCL 5 MG/5ML IJ SOLN
INTRAMUSCULAR | Status: DC | PRN
  Administered 2018-11-10 (×2): 1 mg via INTRAVENOUS

## 2018-11-10 MED ORDER — ONDANSETRON HCL 4 MG PO TABS: 4.0000 mg | ORAL_TABLET | ORAL | Status: DC | PRN

## 2018-11-10 MED ORDER — LACTATED RINGERS IV SOLN: INTRAVENOUS | Status: DC

## 2018-11-10 MED ORDER — FENTANYL CITRATE (PF) 100 MCG/2ML IJ SOLN
INTRAMUSCULAR | Status: DC | PRN
  Administered 2018-11-10: 100 ug via INTRAVENOUS
  Administered 2018-11-10: 50 ug via INTRAVENOUS

## 2018-11-10 MED ORDER — BACITRACIN ZINC 500 UNIT/GM EX OINT
1.0000 "application " | TOPICAL_OINTMENT | Freq: Three times a day (TID) | CUTANEOUS | Status: DC
  Administered 2018-11-10 – 2018-11-11 (×2): 1 via TOPICAL
  Filled 2018-11-10: qty 28.35

## 2018-11-10 MED ORDER — ACETAMINOPHEN 10 MG/ML IV SOLN: 1000.0000 mg | Freq: Once | INTRAVENOUS | Status: DC | PRN

## 2018-11-10 MED ORDER — MIDAZOLAM HCL 2 MG/2ML IJ SOLN
INTRAMUSCULAR | Status: AC
  Filled 2018-11-10: qty 2

## 2018-11-10 MED ORDER — HYDROCODONE-ACETAMINOPHEN 5-325 MG PO TABS: 1.0000 | ORAL_TABLET | ORAL | Status: DC | PRN

## 2018-11-10 MED ORDER — SODIUM CHLORIDE 0.9 % IR SOLN
Status: DC | PRN
  Administered 2018-11-10: 1000 mL

## 2018-11-10 MED ORDER — HYDROMORPHONE HCL 1 MG/ML IJ SOLN
INTRAMUSCULAR | Status: AC
  Filled 2018-11-10: qty 1

## 2018-11-10 MED ORDER — PROPOFOL 10 MG/ML IV BOLUS
INTRAVENOUS | Status: AC
  Filled 2018-11-10: qty 20

## 2018-11-10 MED ORDER — KCL IN DEXTROSE-NACL 20-5-0.45 MEQ/L-%-% IV SOLN
INTRAVENOUS | Status: DC
  Administered 2018-11-10: 20:00:00 via INTRAVENOUS
  Filled 2018-11-10: qty 1000

## 2018-11-10 MED ORDER — IOHEXOL 300 MG/ML  SOLN
75.0000 mL | Freq: Once | INTRAMUSCULAR | Status: AC | PRN
  Administered 2018-11-10: 75 mL via INTRAVENOUS

## 2018-11-10 MED ORDER — HYDROMORPHONE HCL 1 MG/ML IJ SOLN
0.2500 mg | INTRAMUSCULAR | Status: DC | PRN
  Administered 2018-11-10: 0.5 mg via INTRAVENOUS

## 2018-11-10 MED ORDER — LACTATED RINGERS IV SOLN
INTRAVENOUS | Status: DC | PRN
  Administered 2018-11-10: 18:00:00 via INTRAVENOUS

## 2018-11-10 MED ORDER — PROMETHAZINE HCL 25 MG/ML IJ SOLN: 6.2500 mg | INTRAMUSCULAR | Status: DC | PRN

## 2018-11-10 MED ORDER — FENTANYL CITRATE (PF) 250 MCG/5ML IJ SOLN
INTRAMUSCULAR | Status: AC
  Filled 2018-11-10: qty 5

## 2018-11-10 MED ORDER — DEXAMETHASONE SODIUM PHOSPHATE 10 MG/ML IJ SOLN
INTRAMUSCULAR | Status: DC | PRN
  Administered 2018-11-10: 5 mg via INTRAVENOUS

## 2018-11-10 MED ORDER — LIDOCAINE-EPINEPHRINE 1 %-1:100000 IJ SOLN
INTRAMUSCULAR | Status: DC | PRN
  Administered 2018-11-10: 10 mL

## 2018-11-10 MED ORDER — LIDOCAINE-EPINEPHRINE (PF) 1 %-1:200000 IJ SOLN
INTRAMUSCULAR | Status: AC
  Filled 2018-11-10: qty 30

## 2018-11-10 MED ORDER — ONDANSETRON HCL 4 MG/2ML IJ SOLN
INTRAMUSCULAR | Status: DC | PRN
  Administered 2018-11-10: 4 mg via INTRAVENOUS

## 2018-11-10 MED ORDER — CLINDAMYCIN PHOSPHATE 600 MG/50ML IV SOLN
INTRAVENOUS | Status: DC | PRN
  Administered 2018-11-10: 600 mg via INTRAVENOUS

## 2018-11-10 MED ORDER — PROPOFOL 10 MG/ML IV BOLUS
INTRAVENOUS | Status: DC | PRN
  Administered 2018-11-10: 50 mg via INTRAVENOUS
  Administered 2018-11-10: 150 mg via INTRAVENOUS

## 2018-11-10 MED ORDER — ONDANSETRON HCL 4 MG/2ML IJ SOLN: 4.0000 mg | INTRAMUSCULAR | Status: DC | PRN

## 2018-11-10 SURGICAL SUPPLY — 40 items
BLADE SURG 15 STRL LF DISP TIS (BLADE) IMPLANT
BLADE SURG 15 STRL SS (BLADE)
BNDG CONFORM 2 STRL LF (GAUZE/BANDAGES/DRESSINGS) ×3 IMPLANT
BNDG GAUZE ELAST 4 BULKY (GAUZE/BANDAGES/DRESSINGS) ×3 IMPLANT
CATH ROBINSON RED A/P 12FR (CATHETERS) ×3 IMPLANT
COVER SURGICAL LIGHT HANDLE (MISCELLANEOUS) ×6 IMPLANT
COVER WAND RF STERILE (DRAPES) ×3 IMPLANT
DRAIN PENROSE 1/4X12 LTX STRL (WOUND CARE) ×3 IMPLANT
DRAPE HALF SHEET 40X57 (DRAPES) IMPLANT
DRAPE ORTHO SPLIT 77X108 STRL (DRAPES) ×2
DRAPE SURG ORHT 6 SPLT 77X108 (DRAPES) ×1 IMPLANT
DRSG PAD ABDOMINAL 8X10 ST (GAUZE/BANDAGES/DRESSINGS) IMPLANT
ELECT COATED BLADE 2.86 ST (ELECTRODE) ×3 IMPLANT
ELECT REM PT RETURN 9FT ADLT (ELECTROSURGICAL) ×3
ELECTRODE REM PT RTRN 9FT ADLT (ELECTROSURGICAL) ×1 IMPLANT
GAUZE 4X4 16PLY RFD (DISPOSABLE) ×3 IMPLANT
GAUZE SPONGE 4X4 12PLY STRL (GAUZE/BANDAGES/DRESSINGS) IMPLANT
GAUZE SPONGE 4X4 12PLY STRL LF (GAUZE/BANDAGES/DRESSINGS) ×3 IMPLANT
GLOVE BIO SURGEON STRL SZ7.5 (GLOVE) ×3 IMPLANT
GOWN STRL REUS W/ TWL LRG LVL3 (GOWN DISPOSABLE) ×1 IMPLANT
GOWN STRL REUS W/TWL LRG LVL3 (GOWN DISPOSABLE) ×2
KIT BASIN OR (CUSTOM PROCEDURE TRAY) ×3 IMPLANT
KIT TURNOVER KIT B (KITS) ×3 IMPLANT
MARKER SKIN DUAL TIP RULER LAB (MISCELLANEOUS) ×3 IMPLANT
NEEDLE HYPO 25GX1X1/2 BEV (NEEDLE) ×3 IMPLANT
NS IRRIG 1000ML POUR BTL (IV SOLUTION) ×3 IMPLANT
PACK SURGICAL SETUP 50X90 (CUSTOM PROCEDURE TRAY) ×3 IMPLANT
PAD ARMBOARD 7.5X6 YLW CONV (MISCELLANEOUS) ×6 IMPLANT
PENCIL BUTTON HOLSTER BLD 10FT (ELECTRODE) ×3 IMPLANT
POSITIONER HEAD DONUT 9IN (MISCELLANEOUS) IMPLANT
RUBBERBAND STERILE (MISCELLANEOUS) IMPLANT
SUT ETHILON 2 0 FS 18 (SUTURE) ×3 IMPLANT
SUT SILK 2 0 SH CR/8 (SUTURE) IMPLANT
SWAB COLLECTION DEVICE MRSA (MISCELLANEOUS) ×3 IMPLANT
SWAB CULTURE ESWAB REG 1ML (MISCELLANEOUS) ×3 IMPLANT
SYR BULB IRRIGATION 50ML (SYRINGE) ×3 IMPLANT
SYR CONTROL 10ML LL (SYRINGE) IMPLANT
TUBE CONNECTING 12'X1/4 (SUCTIONS) ×1
TUBE CONNECTING 12X1/4 (SUCTIONS) ×2 IMPLANT
YANKAUER SUCT BULB TIP NO VENT (SUCTIONS) ×3 IMPLANT

## 2018-11-10 NOTE — ED Notes (Signed)
Pt. Is undressed, valuables left in security, ready for transport to OR.

## 2018-11-10 NOTE — Anesthesia Postprocedure Evaluation (Signed)
Anesthesia Post Note  Patient: Maurice Swanson  Procedure(s) Performed: INCISION AND DRAINAGE NECK ABSCESS (N/A )     Patient location during evaluation: PACU Anesthesia Type: General Level of consciousness: awake and alert Pain management: pain level controlled Vital Signs Assessment: post-procedure vital signs reviewed and stable Respiratory status: spontaneous breathing, nonlabored ventilation, respiratory function stable and patient connected to nasal cannula oxygen Cardiovascular status: blood pressure returned to baseline and stable Postop Assessment: no apparent nausea or vomiting Anesthetic complications: no    Last Vitals:  Vitals:   11/10/18 1958 11/10/18 2015  BP: 111/64 124/75  Pulse: (!) 59 64  Resp: 16 18  Temp:  36.6 C  SpO2: 95% 97%    Last Pain:  Vitals:   11/10/18 2015  TempSrc: Oral  PainSc:                  Karyl Kinnier Ellender

## 2018-11-10 NOTE — Transfer of Care (Signed)
Immediate Anesthesia Transfer of Care Note  Patient: Maurice Swanson  Procedure(s) Performed: INCISION AND DRAINAGE NECK ABSCESS (N/A )  Patient Location: PACU  Anesthesia Type:General  Level of Consciousness: awake, alert  and oriented  Airway & Oxygen Therapy: Patient Spontanous Breathing and Patient connected to nasal cannula oxygen  Post-op Assessment: Report given to RN and Post -op Vital signs reviewed and stable  Post vital signs: Reviewed and stable  Last Vitals:  Vitals Value Taken Time  BP 127/81 11/10/18 1901  Temp    Pulse 60 11/10/18 1901  Resp 9 11/10/18 1901  SpO2 100 % 11/10/18 1901  Vitals shown include unvalidated device data.  Last Pain:  Vitals:   11/10/18 1747  TempSrc: Oral  PainSc:          Complications: No apparent anesthesia complications

## 2018-11-10 NOTE — Anesthesia Preprocedure Evaluation (Addendum)
Anesthesia Evaluation  Patient identified by MRN, date of birth, ID band Patient awake    Reviewed: Allergy & Precautions, NPO status , Patient's Chart, lab work & pertinent test results  Airway Mallampati: III  TM Distance: >3 FB Neck ROM: Full    Dental  (+) Partial Lower, Upper Dentures, Dental Advisory Given Patient with upper plate. States it is glued in and will not come out.  Patient advised of risk of dental damage if left in.  Patient states "its ok I'm getting a new one on Wednesday".  Dr. Roanna Banning aware.  Partial plate removed lower.:   Pulmonary Current Smoker,    Pulmonary exam normal breath sounds clear to auscultation       Cardiovascular negative cardio ROS Normal cardiovascular exam Rhythm:Regular Rate:Normal     Neuro/Psych negative neurological ROS  negative psych ROS   GI/Hepatic negative GI ROS, Neg liver ROS,   Endo/Other  negative endocrine ROS  Renal/GU negative Renal ROS     Musculoskeletal negative musculoskeletal ROS (+)   Abdominal   Peds  Hematology negative hematology ROS (+)   Anesthesia Other Findings neck abscess  Reproductive/Obstetrics                           Anesthesia Physical Anesthesia Plan  ASA: II and emergent  Anesthesia Plan: General   Post-op Pain Management:    Induction: Intravenous  PONV Risk Score and Plan: 1 and Ondansetron, Dexamethasone, Midazolam and Treatment may vary due to age or medical condition  Airway Management Planned: Oral ETT and Video Laryngoscope Planned  Additional Equipment:   Intra-op Plan:   Post-operative Plan: Extubation in OR  Informed Consent: I have reviewed the patients History and Physical, chart, labs and discussed the procedure including the risks, benefits and alternatives for the proposed anesthesia with the patient or authorized representative who has indicated his/her understanding and acceptance.      Dental advisory given  Plan Discussed with: CRNA  Anesthesia Plan Comments:        Anesthesia Quick Evaluation

## 2018-11-10 NOTE — Anesthesia Procedure Notes (Signed)
Procedure Name: Intubation Date/Time: 11/10/2018 6:25 PM Performed by: Suzy Bouchard, CRNA Pre-anesthesia Checklist: Patient identified, Emergency Drugs available, Suction available, Patient being monitored and Timeout performed Patient Re-evaluated:Patient Re-evaluated prior to induction Oxygen Delivery Method: Circle system utilized Preoxygenation: Pre-oxygenation with 100% oxygen Induction Type: IV induction Ventilation: Mask ventilation without difficulty Laryngoscope Size: Glidescope and 3 Grade View: Grade I Tube type: Oral Tube size: 7.0 mm Number of attempts: 1 Airway Equipment and Method: Video-laryngoscopy and Stylet Placement Confirmation: ETT inserted through vocal cords under direct vision,  positive ETCO2 and breath sounds checked- equal and bilateral Secured at: 21 cm Tube secured with: Tape Dental Injury: Teeth and Oropharynx as per pre-operative assessment

## 2018-11-10 NOTE — ED Provider Notes (Signed)
[facility] Provider Note   CSN: [id number] Arrival date & time: 11/10/18  1037    History   Chief Complaint Chief Complaint  Patient presents with  . Facial Swelling    HPI Maurice Swanson is a 58 y.o. male.     58 year old male presents with complaint of swelling and pain in his chin with painful swallowing.  Patient states he was seen by urgent care 2 weeks ago for a dental abscess, treated with clindamycin which he completed.  Patient developed pain and swelling in his chin about 10 days ago, progressively worsening and now with pain with swallowing.  Patient denies any difficulty breathing, fevers, chills, dental pain at this point.  Patient is unable to shave in the area secondary to pain.  Patient is a daily smoker.  No other complaints or concerns.     Past Medical History:  Diagnosis Date  . Arthritis   . Humerus fracture    left  . Wears partial dentures     Patient Active Problem List   Diagnosis Date Noted  . Closed displaced oblique fracture of shaft of left humerus 10/02/2018    Past Surgical History:  Procedure Laterality Date  . HIP SURGERY    . MULTIPLE TOOTH EXTRACTIONS    . SHOULDER CLOSED REDUCTION Left 10/02/2018   Procedure: manipulation of shoulder and elbow;  Surgeon: Yolonda Kidaogers, Jason Salay, MD;  [facility];  Service: Orthopedics;  Laterality: Left;        Home Medications    Prior to Admission medications   Medication Sig Start Date End Date Taking? Authorizing Provider  oxyCODONE-acetaminophen (PERCOCET) 5-325 MG tablet Take 1-2 tablets by mouth every 6 (six) hours as needed for moderate pain or severe pain. 10/02/18   Yolonda Kidaogers, Jason Gruber, MD    Family History Family History  Problem Relation Age of Onset  . Hypertension Mother     Social History Social History   Tobacco Use  . Smoking status: Current Every Day Smoker    Packs/day: 0.50    Years: 35.00    Pack years: 17.50   Types: Cigarettes  . Smokeless tobacco: Never Used  Substance Use Topics  . Alcohol use: No  . Drug use: No    Types: Cocaine    Comment: Denies 10/01/2018     Allergies   Patient has no known allergies.   Review of Systems Review of Systems  Constitutional: Negative for fever.  HENT: Positive for facial swelling and trouble swallowing. Negative for dental problem and voice change.   Gastrointestinal: Negative for nausea and vomiting.  Musculoskeletal: Negative for neck pain and neck stiffness.  Skin: Positive for color change. Negative for wound.  Allergic/Immunologic: Negative for immunocompromised state.  Hematological: Negative for adenopathy.  All other systems reviewed and are negative.    Physical Exam Updated Vital Signs BP (!) 150/88 (BP Location: Right Arm)   Pulse 67   Temp 98.1 F (36.7 C) (Oral)   Resp 20   SpO2 100%   Physical Exam Vitals signs and nursing note reviewed.  Constitutional:      General: He is not in acute distress.    Appearance: He is well-developed. He is not diaphoretic.  HENT:     Head: Atraumatic.     Jaw: No trismus.      Mouth/Throat:     Comments: Lower dentures. No obvious dental abscess or intraoral infection. Cardiovascular:     Pulses: Normal pulses.  Pulmonary:  Effort: Pulmonary effort is normal.  Musculoskeletal:        General: No swelling or tenderness.  Skin:    General: Skin is warm and dry.     Findings: Erythema present.  Neurological:     Mental Status: He is alert and oriented to person, place, and time.  Psychiatric:        Behavior: Behavior normal.      ED Treatments / Results  Labs (all labs ordered are listed, but only abnormal results are displayed) Labs Reviewed  BASIC METABOLIC PANEL - Abnormal; Notable for the following components:      Result Value   Glucose, Bld 101 (*)    All other components within normal limits  CBC WITH DIFFERENTIAL/PLATELET - Abnormal; Notable for the  following components:   WBC 10.7 (*)    All other components within normal limits  SARS CORONAVIRUS 2 (HOSPITAL ORDER, [facility])    EKG None  Radiology Ct Soft Tissue Neck W Contrast  Result Date: 11/10/2018 CLINICAL DATA:  Enlarging knot under the chin for 1 week. Leukocytosis. EXAM: CT NECK WITH CONTRAST TECHNIQUE: Multidetector CT imaging of the neck was performed using the standard protocol following the bolus administration of intravenous contrast. CONTRAST:  32mL OMNIPAQUE IOHEXOL 300 MG/ML  SOLN COMPARISON:  None. FINDINGS: Pharynx and larynx: Rim enhancing fluid collection in the submental space which is both above and below the mylohyoid. Primary cause is not certain, the sublingual glands are negative and there is no adjacent mandibular teeth. Finding extends to the skin surface, but doubt that a skin process would extend deep to the mylohyoid. No floor of mouth edema or tongue displacement. Salivary glands: No inflammation, mass, or stone. Thyroid: Normal. Lymph nodes: None enlarged or abnormal density. Vascular: Negative. Limited intracranial: Negative Visualized orbits: Negative Mastoids and visualized paranasal sinuses: Clear Skeleton: Generalized degenerative disc narrowing and endplate ridging in the cervical spine. No mandibular erosion next to the fluid collection. Upper chest: Emphysema. IMPRESSION: 3.5 cm cystic mass in the submental space and at the root of the tongue, presumably abscess in this setting. The primary cause is uncertain. Emphysema. Electronically Signed   By: Monte Fantasia M.D.   On: 11/10/2018 13:20    Procedures Procedures (including critical care time)  Medications Ordered in ED Medications  iohexol (OMNIPAQUE) 300 MG/ML solution 75 mL (75 mLs Intravenous Contrast Given 11/10/18 1243)     Initial Impression / Assessment and Plan / ED Course  I have reviewed the triage vital signs and the nursing notes.  Pertinent labs &  imaging results that were available during my care of the patient were reviewed by me and considered in my medical decision making (see chart for details).  Clinical Course as of Nov 09 1520  Sat Nov 10, 9615  4714 58 year old male presents with complaint of swelling to his chin and submandibular space onset 10 days ago and progressively worsening.  Now with pain with swallowing.  On exam patient has swelling with erythema and tenderness to his chin extending to submandibular space, lower dentures in place, no sublingual swelling or tenderness, no evidence of dental abscess.  Patient was in the process of completing clindamycin regimen for upper dental abscess when the symptoms began.  CBC and BMP without significant findings, patient is afebrile, CT with 3.5 cm possible abscess to submental space at the root of the tongue.  Case discussed with Dr. Redmond Baseman, on-call with ENT, request patient remain n.p.o. and  he will come and see the patient, plan to culture in the OR and will plan abx choice from there.   [LM]  1520 Discussed findings and plan of care with patient, agreeable with plan, will remain NPO.   [LM]    Clinical Course User Index [LM] Jeannie FendMurphy, Song Myre A, PA-C      Final Clinical Impressions(s) / ED Diagnoses   Final diagnoses:  Neck abscess    ED Discharge Orders    None       Jeannie FendMurphy, Verena Shawgo A, PA-C 11/10/18 1522    Cathren LaineSteinl, Kevin, MD 11/10/18 1724

## 2018-11-10 NOTE — ED Triage Notes (Signed)
Pt sent from [facility] for eval of abscess underneath the chin. Pt was being treated with Clindamycin for dental abscess.

## 2018-11-10 NOTE — H&P (Signed)
Maurice Swanson is an 58 y.o. male.   Chief Complaint: Neck infection HPI: 58 year old male presents with 10 days of increasing pain and swelling under his chin that has been particularly bad the past three days.  He has plans to have his remaining four lower teeth extracted next week.  He was treated two weeks ago with a course of clindamycin for dental abscess.  Past Medical History:  Diagnosis Date  . Arthritis   . Humerus fracture    left  . Wears partial dentures     Past Surgical History:  Procedure Laterality Date  . HIP SURGERY    . MULTIPLE TOOTH EXTRACTIONS    . SHOULDER CLOSED REDUCTION Left 10/02/2018   Procedure: manipulation of shoulder and elbow;  Surgeon: Yolonda Kidaogers, Jason Sweetin, MD;  [facility];  Service: Orthopedics;  Laterality: Left;    Family History  Problem Relation Age of Onset  . Hypertension Mother    Social History:  reports that he has been smoking cigarettes. He has a 17.50 pack-year smoking history. He has never used smokeless tobacco. He reports that he does not drink alcohol or use drugs.  Allergies: No Known Allergies  (Not in a hospital admission)   Results for orders placed or performed during the hospital encounter of 11/10/18 (from the past 48 hour(s))  Basic metabolic panel     Status: Abnormal   Collection Time: 11/10/18 11:33 AM  Result Value Ref Range   Sodium 137 135 - 145 mmol/L   Potassium 4.6 3.5 - 5.1 mmol/L   Chloride 101 98 - 111 mmol/L   CO2 26 22 - 32 mmol/L   Glucose, Bld 101 (H) 70 - 99 mg/dL   BUN 15 6 - 20 mg/dL   Creatinine, Ser 9.601.03 0.61 - 1.24 mg/dL   Calcium 9.3 8.9 - 45.410.3 mg/dL   GFR calc non Af Amer >60 >60 mL/min   GFR calc Af Amer >60 >60 mL/min   Anion gap 10 5 - 15    Comment: Performed at [facility], [address] [place], [place] [address]  CBC with Differential     Status: Abnormal   Collection Time: 11/10/18 11:33 AM  Result Value Ref Range   WBC 10.7 (H) 4.0 - 10.5 K/uL   RBC 4.33 4.22  - 5.81 MIL/uL   Hemoglobin 13.0 13.0 - 17.0 g/dL   HCT 91.440.3 78.239.0 - 95.652.0 %   MCV 93.1 80.0 - 100.0 fL   MCH 30.0 26.0 - 34.0 pg   MCHC 32.3 30.0 - 36.0 g/dL   RDW 21.312.8 08.611.5 - 57.815.5 %   Platelets 377 150 - 400 K/uL   nRBC 0.0 0.0 - 0.2 %   Neutrophils Relative % 72 %   Neutro Abs 7.7 1.7 - 7.7 K/uL   Lymphocytes Relative 19 %   Lymphs Abs 2.0 0.7 - 4.0 K/uL   Monocytes Relative 8 %   Monocytes Absolute 0.9 0.1 - 1.0 K/uL   Eosinophils Relative 1 %   Eosinophils Absolute 0.1 0.0 - 0.5 K/uL   Basophils Relative 0 %   Basophils Absolute 0.0 0.0 - 0.1 K/uL   Immature Granulocytes 0 %   Abs Immature Granulocytes 0.04 0.00 - 0.07 K/uL    Comment: Performed at [facility], [address] [place], [place] [address]   Ct Soft Tissue Neck W Contrast  Result Date: 11/10/2018 CLINICAL DATA:  Enlarging knot under the chin for 1 week. Leukocytosis.  EXAM: CT NECK WITH CONTRAST TECHNIQUE: Multidetector CT imaging of the neck was performed using the standard protocol following the bolus administration of intravenous contrast. CONTRAST:  18mL OMNIPAQUE IOHEXOL 300 MG/ML  SOLN COMPARISON:  None. FINDINGS: Pharynx and larynx: Rim enhancing fluid collection in the submental space which is both above and below the mylohyoid. Primary cause is not certain, the sublingual glands are negative and there is no adjacent mandibular teeth. Finding extends to the skin surface, but doubt that a skin process would extend deep to the mylohyoid. No floor of mouth edema or tongue displacement. Salivary glands: No inflammation, mass, or stone. Thyroid: Normal. Lymph nodes: None enlarged or abnormal density. Vascular: Negative. Limited intracranial: Negative Visualized orbits: Negative Mastoids and visualized paranasal sinuses: Clear Skeleton: Generalized degenerative disc narrowing and endplate ridging in the cervical spine. No mandibular erosion next to the fluid collection. Upper chest: Emphysema. IMPRESSION: 3.5 cm  cystic mass in the submental space and at the root of the tongue, presumably abscess in this setting. The primary cause is uncertain. Emphysema. Electronically Signed   By: Monte Fantasia M.D.   On: 11/10/2018 13:20    Review of Systems  Musculoskeletal: Positive for neck pain.  All other systems reviewed and are negative.   Blood pressure (!) 150/88, pulse 67, temperature 98.1 F (36.7 C), temperature source Oral, resp. rate 20, SpO2 100 %. Physical Exam  Constitutional: He is oriented to person, place, and time. He appears well-developed and well-nourished. No distress.  HENT:  Head: Normocephalic and atraumatic.  Right Ear: External ear normal.  Left Ear: External ear normal.  Nose: Nose normal.  Mouth/Throat: Oropharynx is clear and moist.  Upper edentulous.  Four lower teeth, two at each canine region.  Eyes: Pupils are equal, round, and reactive to light. Conjunctivae and EOM are normal.  Neck:  Sizeable tender, edematous area under the chin with central purulent area.  Cardiovascular: Normal rate.  Respiratory: Effort normal.  Neurological: He is alert and oriented to person, place, and time. No cranial nerve deficit.  Skin: Skin is warm and dry.  Psychiatric: He has a normal mood and affect. His behavior is normal. Judgment and thought content normal.     Assessment/Plan Odontogenic submental abscess  I personally reviewed his neck CT demonstrating a 3.5 cm fluid collection in the submental space.  This likely represents an odontogenic infection.  He has plans to have his remaining four teeth extracted next week.  I recommended proceeding with incision and drainage of the submental abscess.  Risks, benefits, and alternatives were discussed and he expressed understanding and agreement.  We will plan overnight observation at least and likely discharge him with a Penrose drain in place until after the teeth are extracted.  Melida Quitter, MD 11/10/2018, 3:35 PM

## 2018-11-10 NOTE — Brief Op Note (Signed)
11/10/2018  6:37 PM  PATIENT:  Maurice Swanson  58 y.o. male  PRE-OPERATIVE DIAGNOSIS:  neck abscess  POST-OPERATIVE DIAGNOSIS:  neck abscess  PROCEDURE:  Procedure(s): INCISION AND DRAINAGE NECK ABSCESS (N/A)  SURGEON:  Surgeon(s) and Role:    Melida Quitter, MD - Primary  PHYSICIAN ASSISTANT:   ASSISTANTS: none   ANESTHESIA:   general  EBL: Minimal  BLOOD ADMINISTERED:none  DRAINS: Penrose drain in the submental neck   LOCAL MEDICATIONS USED:  NONE  SPECIMEN:  Source of Specimen:  Submental neck abscess culture  DISPOSITION OF SPECIMEN:  MICRO  COUNTS:  YES  TOURNIQUET:  * No tourniquets in log *  DICTATION: .Other Dictation: Dictation Number [id number]  PLAN OF CARE: Admit for overnight observation  PATIENT DISPOSITION:  PACU - hemodynamically stable.   Delay start of Pharmacological VTE agent (>24hrs) due to surgical blood loss or risk of bleeding: no

## 2018-11-10 NOTE — ED Triage Notes (Signed)
Pt sent from UC for evaluation of dental abscess and now submandibular swelling.

## 2018-11-11 ENCOUNTER — Other Ambulatory Visit: Payer: Self-pay

## 2018-11-11 ENCOUNTER — Encounter (HOSPITAL_COMMUNITY): Payer: Self-pay | Admitting: Otolaryngology

## 2018-11-11 MED ORDER — CLINDAMYCIN HCL 300 MG PO CAPS: 300.0000 mg | ORAL_CAPSULE | Freq: Three times a day (TID) | ORAL | 0 refills | Status: DC

## 2018-11-11 NOTE — Progress Notes (Signed)
Pt discharged home in stable condition after going over discharge teaching with no concerns voiced 

## 2018-11-11 NOTE — Op Note (Signed)
NAME: Maurice Swanson, TOZZI MEDICAL RECORD EY:[id number] ACCOUNT [id number] DATE OF BIRTH:1961-04-17 FACILITY: [facility] LOCATION: [facility] PHYSICIAN:Paidyn Mcferran Guido Sander, MD  OPERATIVE REPORT  DATE OF PROCEDURE:  11/10/2018  PREOPERATIVE DIAGNOSIS:  Submental abscess.  POSTOPERATIVE DIAGNOSIS:  Submental abscess.  PROCEDURE:  Incision and drainage of submental abscess.  SURGEON:  Melida Quitter, MD  ANESTHESIA:  General endotracheal anesthesia.  COMPLICATIONS:  None.  INDICATIONS:  The patient is a 58 year old male who has a 10-day history of increasing swelling and pain in the submental space that has been a lot worse in the last 3 days.  He was recently treated with clindamycin for a dental abscess.  He has plans to  have his remaining 4 lower teeth removed this coming week.  He was found by CT to have a 3.5 cm fluid space in the submental region and presented to the operating room for incision and drainage.  FINDINGS:  The skin overlying the abscess had thinned enough that it was starting to drain through.  Incising the abscess resulted in copious malodorous pus.  Cultures were taken.  DESCRIPTION OF PROCEDURE:  The patient was identified in the holding room, informed consent having been obtained including discussion of risks, benefits and alternatives.  The patient was brought to the operative suite and put on the operative table in  supine position.  Anesthesia was induced, and the patient was intubated by the anesthesia team without difficulty.  The eyes were taped closed and the lower face and neck were prepped and draped in sterile fashion.  The incision was made through the skin  that was already starting to drain using a 15-blade scalpel.  Pus was immediately encountered.  Cultures were taken from within the abscess cavity.  The cavity was then explored with a hemostat.  After pus was fully expressed, a red rubber catheter was  used to irrigate within the abscess cavity copiously.  After this  was completed, a 1/4-inch Penrose drain was placed in the depths of the cavity and secured to the skin using 2-0 nylon suture.  The patient was then cleaned off and drapes were removed.  A  fluff dressing was placed around the neck.  He was given intravenous clindamycin after cultures were taken.  He was then returned to anesthesia for wakeup.  The patient was extubated in the recovery room in stable condition.  LN/NUANCE  D:11/10/2018 T:11/11/2018 JOB:[id number]

## 2018-11-11 NOTE — Plan of Care (Signed)

## 2018-11-11 NOTE — Discharge Summary (Signed)
Physician Discharge Summary  Patient ID: Maurice Swanson MRN: [id number] DOB/AGE: 01/20/1961 58 y.o.  Admit date: 11/10/2018 Discharge date: 11/11/2018  Admission Diagnoses: Submental abscess  Discharge Diagnoses:  Active Problems:   Submental abscess   Discharged Condition: good  Hospital Course: 58 year old male with plans for dental extraction presented with 10 days of worsening submental pain and swelling.  Found to have a sizeable submental abscess and underwent incision and drainage.  He was observed overnight and did well with quite a bit of drainage.  On POD 1, the Penrose drain had come loose and came out.  Swelling was much improved.  He was felt stable for discharge on clindamycin with encouragement to follow-through with dental extraction this week.  Consults: None  Significant Diagnostic Studies: None  Treatments: surgery: Incision and drainage submental abscess  Discharge Exam: Blood pressure 97/69, pulse (!) 52, temperature 98.5 F (36.9 C), temperature source Oral, resp. rate 17, height 5\' 10"  (1.778 m), weight 70.3 kg, SpO2 95 %. General appearance: alert, cooperative and no distress Neck: submental swelling much improved, Penrose drain loose and came out, little bloody drainage  Disposition: Discharge disposition: 01-Home or Self Care       Discharge Instructions    Diet - low sodium heart healthy   Complete by: As directed    Discharge instructions   Complete by: As directed    Change gauze dressing to chin until drainage quits.  Milk wound to encourage drainage.  Keep appointment for dental extraction this week.  Call my office if swelling really increases again.   Increase activity slowly   Complete by: As directed      Allergies as of 11/11/2018   No Known Allergies     Medication List    STOP taking these medications   oxyCODONE-acetaminophen 5-325 MG tablet Commonly known as: Percocet     TAKE these medications   clindamycin 300 MG  capsule Commonly known as: Cleocin Take 1 capsule (300 mg total) by mouth 3 (three) times daily.      Follow-up Information    Melida Quitter, MD. Call.   Specialty: Otolaryngology Why: As needed Contact information: [address] [place]  [address] [phone]           Signed: Melida Quitter 11/11/2018, 8:54 AM

## 2018-11-15 LAB — AEROBIC/ANAEROBIC CULTURE W GRAM STAIN (SURGICAL/DEEP WOUND): Culture: NORMAL

## 2018-11-30 ENCOUNTER — Emergency Department (HOSPITAL_COMMUNITY): Payer: 59

## 2018-11-30 ENCOUNTER — Emergency Department (HOSPITAL_COMMUNITY)
Admission: EM | Admit: 2018-11-30 | Discharge: 2018-12-01 | Disposition: A | Discharge status: Home / Self Care | Payer: 59 | Attending: Emergency Medicine | Admitting: Emergency Medicine

## 2018-11-30 ENCOUNTER — Other Ambulatory Visit: Payer: Self-pay

## 2018-11-30 DIAGNOSIS — S7002XA Contusion of left hip, initial encounter: Secondary | ICD-10-CM | POA: Diagnosis not present

## 2018-11-30 DIAGNOSIS — S46911A Strain of unspecified muscle, fascia and tendon at shoulder and upper arm level, right arm, initial encounter: Secondary | ICD-10-CM | POA: Diagnosis not present

## 2018-11-30 DIAGNOSIS — W1789XA Other fall from one level to another, initial encounter: Secondary | ICD-10-CM | POA: Insufficient documentation

## 2018-11-30 DIAGNOSIS — Y999 Unspecified external cause status: Secondary | ICD-10-CM | POA: Insufficient documentation

## 2018-11-30 DIAGNOSIS — I951 Orthostatic hypotension: Secondary | ICD-10-CM

## 2018-11-30 DIAGNOSIS — Z79899 Other long term (current) drug therapy: Secondary | ICD-10-CM | POA: Diagnosis not present

## 2018-11-30 DIAGNOSIS — Y9289 Other specified places as the place of occurrence of the external cause: Secondary | ICD-10-CM | POA: Insufficient documentation

## 2018-11-30 DIAGNOSIS — Y9389 Activity, other specified: Secondary | ICD-10-CM | POA: Insufficient documentation

## 2018-11-30 DIAGNOSIS — F17210 Nicotine dependence, cigarettes, uncomplicated: Secondary | ICD-10-CM | POA: Insufficient documentation

## 2018-11-30 DIAGNOSIS — S4991XA Unspecified injury of right shoulder and upper arm, initial encounter: Secondary | ICD-10-CM | POA: Diagnosis present

## 2018-11-30 LAB — COMPREHENSIVE METABOLIC PANEL
ALT: 17 U/L (ref 0–44)
AST: 20 U/L (ref 15–41)
Albumin: 3.4 g/dL — ABNORMAL LOW (ref 3.5–5.0)
Alkaline Phosphatase: 123 U/L (ref 38–126)
Anion gap: 10 (ref 5–15)
BUN: 18 mg/dL (ref 6–20)
CO2: 21 mmol/L — ABNORMAL LOW (ref 22–32)
Calcium: 8.8 mg/dL — ABNORMAL LOW (ref 8.9–10.3)
Chloride: 106 mmol/L (ref 98–111)
Creatinine, Ser: 1.11 mg/dL (ref 0.61–1.24)
GFR calc Af Amer: >60 mL/min (ref >60)
GFR calc non Af Amer: >60 mL/min (ref >60)
Glucose, Bld: 140 mg/dL — ABNORMAL HIGH (ref 70–99)
Potassium: 4 mmol/L (ref 3.5–5.1)
Sodium: 137 mmol/L (ref 135–145)
Total Bilirubin: 0.7 mg/dL (ref 0.3–1.2)
Total Protein: 6.4 g/dL — ABNORMAL LOW (ref 6.5–8.1)

## 2018-11-30 LAB — CBC WITH DIFFERENTIAL/PLATELET
Abs Immature Granulocytes: 0.04 10*3/uL (ref 0.00–0.07)
Basophils Absolute: 0 10*3/uL (ref 0.0–0.1)
Basophils Relative: 0 %
Eosinophils Absolute: 0.1 10*3/uL (ref 0.0–0.5)
Eosinophils Relative: 1 %
HCT: 39 % (ref 39.0–52.0)
Hemoglobin: 12.9 g/dL — ABNORMAL LOW (ref 13.0–17.0)
Immature Granulocytes: 0 %
Lymphocytes Relative: 15 %
Lymphs Abs: 1.9 10*3/uL (ref 0.7–4.0)
MCH: 30.9 pg (ref 26.0–34.0)
MCHC: 33.1 g/dL (ref 30.0–36.0)
MCV: 93.5 fL (ref 80.0–100.0)
Monocytes Absolute: 0.6 10*3/uL (ref 0.1–1.0)
Monocytes Relative: 5 %
Neutro Abs: 10.4 10*3/uL — ABNORMAL HIGH (ref 1.7–7.7)
Neutrophils Relative %: 79 %
Platelets: 230 10*3/uL (ref 150–400)
RBC: 4.17 MIL/uL — ABNORMAL LOW (ref 4.22–5.81)
RDW: 13.2 % (ref 11.5–15.5)
WBC: 13 10*3/uL — ABNORMAL HIGH (ref 4.0–10.5)
nRBC: 0 % (ref 0.0–0.2)

## 2018-11-30 LAB — PROTIME-INR
INR: 1.1 (ref 0.8–1.2)
Prothrombin Time: 13.6 seconds (ref 11.4–15.2)

## 2018-11-30 LAB — CBG MONITORING, ED: Glucose-Capillary: 134 mg/dL — ABNORMAL HIGH (ref 70–99)

## 2018-11-30 MED ORDER — IBUPROFEN 600 MG PO TABS: 600.0000 mg | ORAL_TABLET | Freq: Four times a day (QID) | ORAL | 0 refills | Status: DC | PRN

## 2018-11-30 MED ORDER — SODIUM CHLORIDE 0.9 % IV BOLUS
1000.0000 mL | Freq: Once | INTRAVENOUS | Status: AC
  Administered 2018-12-01: 1000 mL via INTRAVENOUS

## 2018-11-30 MED ORDER — ONDANSETRON HCL 4 MG/2ML IJ SOLN
4.0000 mg | Freq: Once | INTRAMUSCULAR | Status: AC
  Administered 2018-11-30: 4 mg via INTRAVENOUS
  Filled 2018-11-30: qty 2

## 2018-11-30 MED ORDER — MORPHINE SULFATE (PF) 4 MG/ML IV SOLN
4.0000 mg | Freq: Once | INTRAVENOUS | Status: AC
  Administered 2018-11-30: 4 mg via INTRAVENOUS
  Filled 2018-11-30: qty 1

## 2018-11-30 MED ORDER — SODIUM CHLORIDE 0.9 % IV BOLUS
1000.0000 mL | Freq: Once | INTRAVENOUS | Status: AC
  Administered 2018-11-30: 1000 mL via INTRAVENOUS

## 2018-11-30 NOTE — Progress Notes (Signed)
Orthopedic Tech Progress Note Patient Details:  Maurice Swanson 16-Dec-1960 [id number]  Ortho Devices Type of Ortho Device: Shoulder immobilizer Ortho Device/Splint Interventions: Ordered     Pt refuse to wear immobilizer in bed, says he will put it on when he is discharged.   Melony Overly T 11/30/2018, 11:47 PM

## 2018-11-30 NOTE — ED Provider Notes (Addendum)
[facility]   CSN: [id number] Arrival date & time: 11/30/18  2052    History   Chief Complaint Chief Complaint  Patient presents with  . Fall    HPI Maurice Swanson is a 58 y.o. male.     Pt presents to the ED today with 2 problems.  Pt said he was standing on the toolbox in the back of his truck in the rain trying to reach something when he slipped and fell hitting his left hip.  The pt went inside to sit down.  He sat for about 20 minutes and then got back up to walk around.  He felt lightheaded and dizzy and passed out hitting his right shoulder.  He said he has not been eating or drinking much today.  Pt denies cp or sob.  No f/c.  No n/v/d.     Past Medical History:  Diagnosis Date  . Arthritis   . Humerus fracture    left  . Wears partial dentures     Patient Active Problem List   Diagnosis Date Noted  . Submental abscess 11/10/2018  . Closed displaced oblique fracture of shaft of left humerus 10/02/2018    Past Surgical History:  Procedure Laterality Date  . HIP SURGERY    . INCISION AND DRAINAGE ABSCESS N/A 11/10/2018   Procedure: INCISION AND DRAINAGE NECK ABSCESS;  Surgeon: Christia ReadingBates, Dwight, MD;  [facility];  Service: ENT;  Laterality: N/A;  . MULTIPLE TOOTH EXTRACTIONS    . SHOULDER CLOSED REDUCTION Left 10/02/2018   Procedure: manipulation of shoulder and elbow;  Surgeon: Yolonda Kidaogers, Jason Diebel, MD;  [facility];  Service: Orthopedics;  Laterality: Left;        Home Medications    Prior to Admission medications   Medication Sig Start Date End Date Taking? Authorizing Provider  clindamycin (CLEOCIN) 300 MG capsule Take 1 capsule (300 mg total) by mouth 3 (three) times daily. 11/11/18   Christia ReadingBates, Dwight, MD    Family History Family History  Problem Relation Age of Onset  . Hypertension Mother     Social History Social History   Tobacco Use  . Smoking status: Current Every Day Smoker   Packs/day: 0.50    Years: 35.00    Pack years: 17.50    Types: Cigarettes  . Smokeless tobacco: Never Used  Substance Use Topics  . Alcohol use: No  . Drug use: No    Types: Cocaine    Comment: Denies 10/01/2018     Allergies   Patient has no known allergies.   Review of Systems Review of Systems  Musculoskeletal:       Right shoulder, left hip pain  Neurological: Positive for syncope.  All other systems reviewed and are negative.    Physical Exam Updated Vital Signs BP 113/83 (BP Location: Left Arm)   Pulse 60   Temp 98.1 F (36.7 C) (Oral)   Resp 17   Ht 5\' 10"  (1.778 m)   Wt 68 kg   SpO2 99%   BMI 21.52 kg/m   Physical Exam Vitals signs and nursing note reviewed.  Constitutional:      Appearance: Normal appearance.  HENT:     Head: Normocephalic and atraumatic.     Right Ear: External ear normal.     Left Ear: External ear normal.     Nose: Nose normal.     Mouth/Throat:     Mouth: Mucous membranes are dry.  Pharynx: Oropharynx is clear.  Eyes:     Extraocular Movements: Extraocular movements intact.     Conjunctiva/sclera: Conjunctivae normal.     Pupils: Pupils are equal, round, and reactive to light.  Neck:     Musculoskeletal: Normal range of motion and neck supple.  Cardiovascular:     Rate and Rhythm: Normal rate and regular rhythm.     Pulses: Normal pulses.     Heart sounds: Normal heart sounds.  Pulmonary:     Effort: Pulmonary effort is normal.     Breath sounds: Normal breath sounds.  Abdominal:     General: Abdomen is flat. Bowel sounds are normal.     Palpations: Abdomen is soft.  Musculoskeletal:     Right shoulder: He exhibits tenderness.       Legs:  Skin:    General: Skin is warm.     Capillary Refill: Capillary refill takes less than 2 seconds.  Neurological:     General: No focal deficit present.     Mental Status: He is alert and oriented to person, place, and time.  Psychiatric:        Mood and Affect: Mood  normal.        Behavior: Behavior normal.      ED Treatments / Results  Labs (all labs ordered are listed, but only abnormal results are displayed) Labs Reviewed  CBC WITH DIFFERENTIAL/PLATELET - Abnormal; Notable for the following components:      Result Value   WBC 13.0 (*)    RBC 4.17 (*)    Hemoglobin 12.9 (*)    Neutro Abs 10.4 (*)    All other components within normal limits  COMPREHENSIVE METABOLIC PANEL - Abnormal; Notable for the following components:   CO2 21 (*)    Glucose, Bld 140 (*)    Calcium 8.8 (*)    Total Protein 6.4 (*)    Albumin 3.4 (*)    All other components within normal limits  CBG MONITORING, ED - Abnormal; Notable for the following components:   Glucose-Capillary 134 (*)    All other components within normal limits  PROTIME-INR  URINALYSIS, ROUTINE W REFLEX MICROSCOPIC    EKG None  EKG not coming through on MUSE  HR 61.  NSR.  No st or t wave changes.  No sig change from prior EKG.  Radiology Dg Chest 2 View  Result Date: 11/30/2018 CLINICAL DATA:  Pain EXAM: CHEST - 2 VIEW COMPARISON:  09/02/2018 FINDINGS: There is hyperinflation of the lungs compatible with COPD. Heart and mediastinal contours are within normal limits. No focal opacities or effusions. No acute bony abnormality. IMPRESSION: Hyperinflation/COPD.  No active cardiopulmonary disease. Electronically Signed   By: Charlett NoseKevin  Dover M.D.   On: 11/30/2018 22:19   Dg Shoulder Right  Result Date: 11/30/2018 CLINICAL DATA:  Fall, right shoulder pain EXAM: RIGHT SHOULDER - 2+ VIEW COMPARISON:  November 15, 2013 FINDINGS: There is no evidence of fracture or dislocation. There is no evidence of arthropathy or other focal bone abnormality. Soft tissues are unremarkable. IMPRESSION: Negative. Electronically Signed   By: Charlett NoseKevin  Dover M.D.   On: 11/30/2018 22:20   Dg Hip Unilat W Or Wo Pelvis 2-3 Views Left  Result Date: 11/30/2018 CLINICAL DATA:  Pain EXAM: DG HIP (WITH OR WITHOUT PELVIS) 2-3V LEFT  COMPARISON:  None. FINDINGS: Hardware in the proximal right femur later prior internal fixation. No acute bony abnormality. Specifically, no fracture, subluxation, or dislocation. IMPRESSION: No acute bony abnormality. Electronically  Signed   By: Rolm Baptise M.D.   On: 11/30/2018 22:18    Procedures Procedures (including critical care time)  Medications Ordered in ED Medications  sodium chloride 0.9 % bolus 1,000 mL (has no administration in time range)  morphine 4 MG/ML injection 4 mg (4 mg Intravenous Given 11/30/18 2221)  ondansetron (ZOFRAN) injection 4 mg (4 mg Intravenous Given 11/30/18 2221)  sodium chloride 0.9 % bolus 1,000 mL (1,000 mLs Intravenous New Bag/Given 11/30/18 2235)     Initial Impression / Assessment and Plan / ED Course  I have reviewed the triage vital signs and the nursing notes.  Pertinent labs & imaging results that were available during my care of the patient were reviewed by me and considered in my medical decision making (see chart for details).    Pt is feeling better after IVFs, but is still orthostatic.  He will be given an additional L of fluids and orthostatics will be rechecked.  This is the cause of his syncope.  He did not break anything, but may have strained his shoulder.  Pt placed in a sling.  He is instructed to drink lots of fluids.  F/u with ortho.  Return if worse.    Pt received a rx for percocet 10 days ago for 30 percocets, so no more narcotics will be prescribed.  Final Clinical Impressions(s) / ED Diagnoses   Final diagnoses:  Orthostatic hypotension  Strain of right shoulder, initial encounter  Contusion of left hip, initial encounter    ED Discharge Orders    None       Isla Pence, MD 11/30/18 2330    Isla Pence, MD 11/30/18 2333

## 2018-11-30 NOTE — ED Triage Notes (Signed)
Pt. Arrived from EMS with c/o of a fall. Pt. Fell from the bed of his truck and landed on his left hip. Pt. walked inside and had a syncopal episode and fell again on his right shoulder. VS upon arrival: HR: 60, BP: 118/72, RR: 16, Temp: 98.1, SpO2: 95 (RA). CBG: 98.

## 2018-12-01 MED ORDER — NAPROXEN 500 MG PO TABS: 500.0000 mg | ORAL_TABLET | Freq: Two times a day (BID) | ORAL | 0 refills | Status: DC

## 2018-12-01 NOTE — ED Notes (Signed)
Per otho tec, pt declined to place of sling, will put on when he gets home.

## 2018-12-01 NOTE — ED Provider Notes (Signed)
12:00 AM  Assumed care from Dr. Gilford Raid.  Patient is a 58 year old male who presented to the emergency department with syncope.  Patient is extremely orthostatic here.  Getting total of 2 L of IV fluids and then will need to be reassessed.  EKG shows no ischemic abnormality, arrhythmia or interval changes.  1:38 AM  Pt reports feeling much better after IV fluids and is no longer orthostatic.  Complaining of some discomfort in his left hip.  He is able to ambulate.  His x-rays are unremarkable.  Requesting to be discharged with naproxen for pain control.  Recommend increase fluid intake at home.  Patient comfortable with this plan.   At this time, I do not feel there is any life-threatening condition present. I have reviewed and discussed all results (EKG, imaging, lab, urine as appropriate) and exam findings with patient/family. I have reviewed nursing notes and appropriate previous records.  I feel the patient is safe to be discharged home without further emergent workup and can continue workup as an outpatient as needed. Discussed usual and customary return precautions. Patient/family verbalize understanding and are comfortable with this plan.  Outpatient follow-up has been provided as needed. All questions have been answered.    Date: 12/01/2018 20:58  Rate: 61  Rhythm: normal sinus rhythm  QRS Axis: normal  Intervals: normal  ST/T Wave abnormalities: normal  Conduction Disutrbances: none  Narrative Interpretation: unremarkable; no change compared to previous        Maurice Swanson, [employer], DO 12/01/18 0139

## 2019-07-09 ENCOUNTER — Emergency Department (HOSPITAL_COMMUNITY)
Admission: EM | Admit: 2019-07-09 | Discharge: 2019-07-09 | Disposition: A | Discharge status: Home / Self Care | Payer: 59 | Attending: Emergency Medicine | Admitting: Emergency Medicine

## 2019-07-09 ENCOUNTER — Emergency Department (HOSPITAL_COMMUNITY): Payer: 59

## 2019-07-09 ENCOUNTER — Other Ambulatory Visit: Payer: Self-pay

## 2019-07-09 ENCOUNTER — Encounter (HOSPITAL_COMMUNITY): Payer: Self-pay | Admitting: Emergency Medicine

## 2019-07-09 DIAGNOSIS — R0789 Other chest pain: Secondary | ICD-10-CM | POA: Diagnosis not present

## 2019-07-09 DIAGNOSIS — R1012 Left upper quadrant pain: Secondary | ICD-10-CM | POA: Diagnosis not present

## 2019-07-09 DIAGNOSIS — F17210 Nicotine dependence, cigarettes, uncomplicated: Secondary | ICD-10-CM | POA: Diagnosis not present

## 2019-07-09 DIAGNOSIS — Z79899 Other long term (current) drug therapy: Secondary | ICD-10-CM | POA: Insufficient documentation

## 2019-07-09 LAB — BASIC METABOLIC PANEL
Anion gap: 11 (ref 5–15)
BUN: 14 mg/dL (ref 6–20)
CO2: 25 mmol/L (ref 22–32)
Calcium: 9 mg/dL (ref 8.9–10.3)
Chloride: 101 mmol/L (ref 98–111)
Creatinine, Ser: 1.03 mg/dL (ref 0.61–1.24)
GFR calc Af Amer: >60 mL/min (ref >60)
GFR calc non Af Amer: >60 mL/min (ref >60)
Glucose, Bld: 90 mg/dL (ref 70–99)
Potassium: 4.2 mmol/L (ref 3.5–5.1)
Sodium: 137 mmol/L (ref 135–145)

## 2019-07-09 LAB — CBC
HCT: 37.7 % — ABNORMAL LOW (ref 39.0–52.0)
Hemoglobin: 12.2 g/dL — ABNORMAL LOW (ref 13.0–17.0)
MCH: 29.9 pg (ref 26.0–34.0)
MCHC: 32.4 g/dL (ref 30.0–36.0)
MCV: 92.4 fL (ref 80.0–100.0)
Platelets: 316 10*3/uL (ref 150–400)
RBC: 4.08 MIL/uL — ABNORMAL LOW (ref 4.22–5.81)
RDW: 13.9 % (ref 11.5–15.5)
WBC: 6.5 10*3/uL (ref 4.0–10.5)
nRBC: 0 % (ref 0.0–0.2)

## 2019-07-09 LAB — TROPONIN I (HIGH SENSITIVITY)
Troponin I (High Sensitivity): 4 ng/L (ref <18)
Troponin I (High Sensitivity): 4 ng/L (ref <18)

## 2019-07-09 MED ORDER — IOHEXOL 350 MG/ML SOLN
100.0000 mL | Freq: Once | INTRAVENOUS | Status: AC | PRN
  Administered 2019-07-09: 100 mL via INTRAVENOUS

## 2019-07-09 MED ORDER — ONDANSETRON HCL 4 MG/2ML IJ SOLN
4.0000 mg | Freq: Once | INTRAMUSCULAR | Status: AC
  Administered 2019-07-09: 4 mg via INTRAVENOUS
  Filled 2019-07-09: qty 2

## 2019-07-09 MED ORDER — MORPHINE SULFATE (PF) 4 MG/ML IV SOLN
4.0000 mg | Freq: Once | INTRAVENOUS | Status: AC
  Administered 2019-07-09: 4 mg via INTRAVENOUS
  Filled 2019-07-09: qty 1

## 2019-07-09 MED ORDER — TRAMADOL HCL 50 MG PO TABS: 50.0000 mg | ORAL_TABLET | Freq: Four times a day (QID) | ORAL | 0 refills | Status: DC | PRN

## 2019-07-09 MED ORDER — NAPROXEN 500 MG PO TABS: 500.0000 mg | ORAL_TABLET | Freq: Two times a day (BID) | ORAL | 0 refills | Status: DC

## 2019-07-09 MED ORDER — SODIUM CHLORIDE 0.9 % IV BOLUS
1000.0000 mL | Freq: Once | INTRAVENOUS | Status: AC
  Administered 2019-07-09: 1000 mL via INTRAVENOUS

## 2019-07-09 MED ORDER — SODIUM CHLORIDE 0.9% FLUSH
3.0000 mL | Freq: Once | INTRAVENOUS | Status: AC
  Administered 2019-07-09: 03:00:00 3 mL via INTRAVENOUS

## 2019-07-09 NOTE — Discharge Instructions (Addendum)
Begin taking naproxen as prescribed and tramadol as prescribed as needed for pain not relieved with naproxen.  Follow-up with your primary doctor if your symptoms or not improving in the next few days.

## 2019-07-09 NOTE — ED Provider Notes (Signed)
[facility] EMERGENCY DEPARTMENT Provider Note   CSN: [id number] Arrival date & time: 07/09/19  0127     History Chief Complaint  Patient presents with  . Chest Pain  . Shortness of Breath    Maurice Swanson is a 59 y.o. male.  Patient is a 59 year old male with history of arthritis.  He presents today for evaluation of left-sided chest pain.  He describes severe pain to the left upper quadrant radiating into the left flank.  This began earlier this morning and is worsening.  He denies any injury or trauma.  He denies fevers or chills.  He denies cough.  He denies any bowel or bladder complaints.  Pain is worse with movement and palpation.  There are no alleviating factors.  The history is provided by the patient.  Chest Pain Pain location:  L lateral chest Pain quality: sharp   Pain radiates to:  Upper back Pain severity:  Moderate Onset quality:  Sudden Duration:  12 hours Timing:  Constant Progression:  Worsening Chronicity:  New Associated symptoms: shortness of breath   Shortness of Breath Associated symptoms: chest pain        Past Medical History:  Diagnosis Date  . Arthritis   . Humerus fracture    left  . Wears partial dentures     Patient Active Problem List   Diagnosis Date Noted  . Submental abscess 11/10/2018  . Closed displaced oblique fracture of shaft of left humerus 10/02/2018    Past Surgical History:  Procedure Laterality Date  . HIP SURGERY    . INCISION AND DRAINAGE ABSCESS N/A 11/10/2018   Procedure: INCISION AND DRAINAGE NECK ABSCESS;  Surgeon: Christia Reading, MD;  Location[facility] OR;  Service: ENT;  Laterality: N/A;  . MULTIPLE TOOTH EXTRACTIONS    . SHOULDER CLOSED REDUCTION Left 10/02/2018   Procedure: manipulation of shoulder and elbow;  Surgeon: Yolonda Kida, MD;  Location: [facility] OR;  Service: Orthopedics;  Laterality: Left;       Family History  Problem Relation Age of Onset  . Hypertension Mother      Social History   Tobacco Use  . Smoking status: Current Every Day Smoker    Packs/day: 0.50    Years: 35.00    Pack years: 17.50    Types: Cigarettes  . Smokeless tobacco: Never Used  Substance Use Topics  . Alcohol use: No  . Drug use: No    Types: Cocaine    Comment: Denies 10/01/2018    Home Medications Prior to Admission medications   Medication Sig Start Date End Date Taking? Authorizing Provider  clindamycin (CLEOCIN) 300 MG capsule Take 1 capsule (300 mg total) by mouth 3 (three) times daily. 11/11/18   Christia Reading, MD  ibuprofen (ADVIL) 600 MG tablet Take 1 tablet (600 mg total) by mouth every 6 (six) hours as needed. 11/30/18   Jacalyn Lefevre, MD  naproxen (NAPROSYN) 500 MG tablet Take 1 tablet (500 mg total) by mouth 2 (two) times daily with a meal. As needed for pain. 12/01/18   Ward, Layla Maw, DO    Allergies    Patient has no known allergies.  Review of Systems   Review of Systems  Respiratory: Positive for shortness of breath.   Cardiovascular: Positive for chest pain.  All other systems reviewed and are negative.   Physical Exam Updated Vital Signs BP (!) 125/95 (BP Location: Right Arm)   Pulse 60   Temp (!) 97.4 F (36.3  C) (Oral)   Resp 16   SpO2 99%   Physical Exam Vitals and nursing note reviewed.  Constitutional:      General: He is not in acute distress.    Appearance: He is well-developed. He is not diaphoretic.  HENT:     Head: Normocephalic and atraumatic.  Cardiovascular:     Rate and Rhythm: Normal rate and regular rhythm.     Heart sounds: No murmur. No friction rub.  Pulmonary:     Effort: Pulmonary effort is normal. No respiratory distress.     Breath sounds: Normal breath sounds. No wheezing or rales.  Abdominal:     General: Bowel sounds are normal. There is no distension.     Palpations: Abdomen is soft.     Tenderness: There is abdominal tenderness. There is no guarding or rebound.     Comments: There is tenderness to  palpation of the left upper quadrant.  There are no pulsatile masses.  He is also tender in the left flank.  Musculoskeletal:        General: Normal range of motion.     Cervical back: Normal range of motion and neck supple.  Skin:    General: Skin is warm and dry.  Neurological:     Mental Status: He is alert and oriented to person, place, and time.     Coordination: Coordination normal.     ED Results / Procedures / Treatments   Labs (all labs ordered are listed, but only abnormal results are displayed) Labs Reviewed  CBC - Abnormal; Notable for the following components:      Result Value   RBC 4.08 (*)    Hemoglobin 12.2 (*)    HCT 37.7 (*)    All other components within normal limits  BASIC METABOLIC PANEL  URINALYSIS, ROUTINE W REFLEX MICROSCOPIC  TROPONIN I (HIGH SENSITIVITY)    EKG EKG Interpretation  Date/Time:  Tuesday July 09 2019 01:35:38 EST Ventricular Rate:  65 PR Interval:  130 QRS Duration: 82 QT Interval:  414 QTC Calculation: 430 R Axis:   73 Text Interpretation: Sinus rhythm with Premature supraventricular complexes Septal infarct , age undetermined Abnormal ECG Confirmed by Geoffery Lyons ([id number]) on 07/09/2019 2:25:41 AM   Radiology DG Chest 2 View  Result Date: 07/09/2019 CLINICAL DATA:  Chest pain radiating to the back, shortness of breath for 1 day EXAM: CHEST - 2 VIEW COMPARISON:  11/30/2018 FINDINGS: Frontal and lateral views of the chest demonstrate an unremarkable cardiac silhouette. No airspace disease, effusion, or pneumothorax. There is a 2 cm nodular density projecting over the right anterior third rib, which could reflect pulmonary nodule. Nonemergent CT chest without contrast recommended for follow-up. No acute bony abnormalities. IMPRESSION: 1. Possible 2 cm right upper lobe pulmonary nodule. Nonemergent follow-up CT chest without contrast recommended. 2. No acute airspace disease. Electronically Signed   By: Sharlet Salina M.D.   On:  07/09/2019 02:01    Procedures Procedures (including critical care time)  Medications Ordered in ED Medications  sodium chloride flush (NS) 0.9 % injection 3 mL (has no administration in time range)  sodium chloride 0.9 % bolus 1,000 mL (has no administration in time range)  morphine 4 MG/ML injection 4 mg (has no administration in time range)  ondansetron (ZOFRAN) injection 4 mg (has no administration in time range)    ED Course  I have reviewed the triage vital signs and the nursing notes.  Pertinent labs & imaging results that were available  during my care of the patient were reviewed by me and considered in my medical decision making (see chart for details).    MDM Rules/Calculators/A&P  Patient is a 59 year old male presenting with complaints of discomfort to the left chest and left flank and left upper quadrant.  This began suddenly this evening.  This began in the absence of any injury or trauma.  Patient's vitals are stable and his work-up thus far is essentially unremarkable.  He does have findings of possible enteritis within a loop of bowel in the left upper quadrant, however there are no other acute findings.  There is no evidence for PE, dissection, pneumothorax, or other significant pathology.  His laboratory studies are reassuring and EKG and troponin does not reflect a cardiac etiology.  His symptoms are extremely atypical for cardiac pain.  Patient will be discharged with medication for what appears to be musculoskeletal chest pain.  He will be given naproxen and tramadol and is to follow-up with primary doctor as needed if not improving.  Final Clinical Impression(s) / ED Diagnoses Final diagnoses:  None    Rx / DC Orders ED Discharge Orders    None       Veryl Speak, MD 07/09/19 [phone]

## 2019-07-09 NOTE — ED Triage Notes (Signed)
Pt c/o chest pain that radiates to the back and shortness of breath x 1 day.

## 2020-04-13 ENCOUNTER — Other Ambulatory Visit: Payer: Self-pay | Admitting: Family Medicine

## 2020-04-13 DIAGNOSIS — Z122 Encounter for screening for malignant neoplasm of respiratory organs: Secondary | ICD-10-CM

## 2020-04-30 ENCOUNTER — Inpatient Hospital Stay: Admission: RE | Admit: 2020-04-30 | Payer: 59 | Source: Ambulatory Visit

## 2020-05-21 ENCOUNTER — Inpatient Hospital Stay: Admission: RE | Admit: 2020-05-21 | Payer: 59 | Source: Ambulatory Visit

## 2020-12-20 ENCOUNTER — Ambulatory Visit (INDEPENDENT_AMBULATORY_CARE_PROVIDER_SITE_OTHER): Payer: 59

## 2020-12-20 ENCOUNTER — Ambulatory Visit: Payer: 59

## 2020-12-20 ENCOUNTER — Ambulatory Visit
Admission: EM | Admit: 2020-12-20 | Discharge: 2020-12-20 | Disposition: A | Discharge status: Home / Self Care | Payer: 59 | Attending: Urgent Care | Admitting: Urgent Care

## 2020-12-20 DIAGNOSIS — M7989 Other specified soft tissue disorders: Secondary | ICD-10-CM | POA: Diagnosis not present

## 2020-12-20 DIAGNOSIS — M79641 Pain in right hand: Secondary | ICD-10-CM

## 2020-12-20 DIAGNOSIS — T23251A Burn of second degree of right palm, initial encounter: Secondary | ICD-10-CM | POA: Diagnosis not present

## 2020-12-20 DIAGNOSIS — T148XXA Other injury of unspecified body region, initial encounter: Secondary | ICD-10-CM

## 2020-12-20 DIAGNOSIS — L089 Local infection of the skin and subcutaneous tissue, unspecified: Secondary | ICD-10-CM

## 2020-12-20 DIAGNOSIS — M79644 Pain in right finger(s): Secondary | ICD-10-CM | POA: Diagnosis not present

## 2020-12-20 MED ORDER — AMOXICILLIN-POT CLAVULANATE 875-125 MG PO TABS: 1.0000 | ORAL_TABLET | Freq: Two times a day (BID) | ORAL | 0 refills | Status: DC

## 2020-12-20 MED ORDER — SILVER SULFADIAZINE 1 % EX CREA: 1.0000 "application " | TOPICAL_CREAM | Freq: Every day | CUTANEOUS | 0 refills | Status: DC

## 2020-12-20 MED ORDER — NAPROXEN 500 MG PO TABS: 500.0000 mg | ORAL_TABLET | Freq: Two times a day (BID) | ORAL | 0 refills | Status: DC

## 2020-12-20 NOTE — Discharge Instructions (Addendum)
Start Augmentin to address an infection from the puncture wound he suffered to the left thumb.  Follow-up with a hand specialist this upcoming week.  Call and make an appointment tomorrow.  Make sure that you apply ointment to the burn on your hand and secure this with gauze and Coban.  Do this until your wound completely scabs over and the new skin forms underneath.  Do not rip off any of the scab or dead skin.  It will simply be removed as you to regular handwashing and hygiene.

## 2020-12-20 NOTE — ED Triage Notes (Addendum)
One week ago, Pt injured the tip of his right thumb and burned the palm surface of his right hand while at work. Notes blister on the palm of his hand from the burn. Right finger pain and swelling worsened as of last night. Has been using OTC antibiotic ointment without relief. Pain is interfering with his sleep.

## 2020-12-20 NOTE — ED Provider Notes (Signed)
[facility]   MRN: [id number] DOB: 06/25/1960  Subjective:   Maurice Swanson is a 60 y.o. male presenting for 1 week history of persistent and worsening right thumb pain with swelling.  Symptoms started from a work injury, patient was using a screw gun and excellently went into his right thumb.  This is a first time he has been seen for this.  Has had worsening severe pain.  Has not noted any particular drainage.  He also suffered a burn to the palm of the right hand while at work as well.  Has been using antibiotic ointment with minimal relief.  Denies fever, nausea, vomiting, hand swelling, red streaking along the thumb into the hand.  Tdap was updated 09/02/2018.  No current facility-administered medications for this encounter.  Current Outpatient Medications:    clindamycin (CLEOCIN) 300 MG capsule, Take 1 capsule (300 mg total) by mouth 3 (three) times daily. (Patient not taking: Reported on 07/09/2019), Disp: 30 capsule, Rfl: 0   ibuprofen (ADVIL) 600 MG tablet, Take 1 tablet (600 mg total) by mouth every 6 (six) hours as needed. (Patient not taking: Reported on 07/09/2019), Disp: 30 tablet, Rfl: 0   naproxen (NAPROSYN) 500 MG tablet, Take 1 tablet (500 mg total) by mouth 2 (two) times daily with a meal., Disp: 15 tablet, Rfl: 0   traMADol (ULTRAM) 50 MG tablet, Take 1 tablet (50 mg total) by mouth every 6 (six) hours as needed., Disp: 12 tablet, Rfl: 0   No Known Allergies  Past Medical History:  Diagnosis Date   Arthritis    Humerus fracture    left   Wears partial dentures      Past Surgical History:  Procedure Laterality Date   HIP SURGERY     INCISION AND DRAINAGE ABSCESS N/A 11/10/2018   Procedure: INCISION AND DRAINAGE NECK ABSCESS;  Surgeon: Christia Reading, MD;  Location: [facility] OR;  Service: ENT;  Laterality: N/A;   MULTIPLE TOOTH EXTRACTIONS     SHOULDER CLOSED REDUCTION Left 10/02/2018   Procedure: manipulation of shoulder and elbow;  Surgeon: Yolonda Kida, MD;  Location: [facility] OR;  Service: Orthopedics;  Laterality: Left;    Family History  Problem Relation Age of Onset   Hypertension Mother     Social History   Tobacco Use   Smoking status: Every Day    Packs/day: 0.50    Years: 35.00    Pack years: 17.50    Types: Cigarettes   Smokeless tobacco: Never  Vaping Use   Vaping Use: Never used  Substance Use Topics   Alcohol use: No   Drug use: No    Types: Cocaine    Comment: Denies 10/01/2018    ROS   Objective:   Vitals: BP 128/86 (BP Location: Left Arm)   Pulse 62   Temp 97.9 F (36.6 C) (Oral)   Resp 18   SpO2 96%   Physical Exam Constitutional:      General: He is not in acute distress.    Appearance: Normal appearance. He is well-developed and normal weight. He is not ill-appearing, toxic-appearing or diaphoretic.  HENT:     Head: Normocephalic and atraumatic.     Right Ear: External ear normal.     Left Ear: External ear normal.     Nose: Nose normal.     Mouth/Throat:     Pharynx: Oropharynx is clear.  Eyes:     General: No scleral icterus.       Right  eye: No discharge.        Left eye: No discharge.     Extraocular Movements: Extraocular movements intact.     Pupils: Pupils are equal, round, and reactive to light.  Cardiovascular:     Rate and Rhythm: Normal rate.  Pulmonary:     Effort: Pulmonary effort is normal.  Musculoskeletal:       Hands:     Cervical back: Normal range of motion.  Skin:    General: Skin is warm and dry.  Neurological:     Mental Status: He is alert and oriented to person, place, and time.  Psychiatric:        Mood and Affect: Mood normal.        Behavior: Behavior normal.        Thought Content: Thought content normal.        Judgment: Judgment normal.    DG Hand Complete Right  Result Date: 12/20/2020 CLINICAL DATA:  RIGHT hand pain and swelling. Burn injury of the RIGHT thumb and palm at work. EXAM: RIGHT HAND - COMPLETE 3+ VIEW COMPARISON:  None.  FINDINGS: There is no acute fracture or subluxation. No radiopaque foreign body or soft tissue gas. Remote ulnar styloid fracture. IMPRESSION: No evidence for acute  abnormality. Electronically Signed   By: Norva Pavlov M.D.   On: 12/20/2020 12:43     Assessment and Plan :   PDMP not reviewed this encounter.  1. Infected wound   2. Thumb pain, right   3. Swelling of right thumb   4. Partial thickness burn of palm of right hand, initial encounter     Start Augmentin for anaerobic coverage of an infected puncture wound to the right thumb.  Recommended close follow-up with hand specialist.  Wound care provided to the burn, nonviable tissue removed.  Applied bacitracin ointment, covered with nonadherent dressing and secured with Coban.  Apply Silvadene going forward, prescription sent to the pharmacy electronically. Counseled patient on potential for adverse effects with medications prescribed/recommended today, ER and return-to-clinic precautions discussed, patient verbalized understanding.    Wallis Bamberg, PA-C 12/20/20 1344

## 2021-01-11 ENCOUNTER — Other Ambulatory Visit: Payer: Self-pay

## 2021-01-11 ENCOUNTER — Ambulatory Visit
Admission: EM | Admit: 2021-01-11 | Discharge: 2021-01-11 | Disposition: A | Discharge status: Home / Self Care | Payer: 59 | Attending: Physician Assistant | Admitting: Physician Assistant

## 2021-01-11 ENCOUNTER — Encounter: Payer: Self-pay | Admitting: Emergency Medicine

## 2021-01-11 DIAGNOSIS — Z20822 Contact with and (suspected) exposure to covid-19: Secondary | ICD-10-CM

## 2021-01-11 DIAGNOSIS — J069 Acute upper respiratory infection, unspecified: Secondary | ICD-10-CM | POA: Diagnosis not present

## 2021-01-11 MED ORDER — PROMETHAZINE-DM 6.25-15 MG/5ML PO SYRP: 5.0000 mL | ORAL_SOLUTION | Freq: Four times a day (QID) | ORAL | 0 refills | Status: DC | PRN

## 2021-01-11 NOTE — Discharge Instructions (Addendum)
Can use cough syrup as needed for cough Recommend Flonase and Mucinex.  Rest and drink fluids Return if you develop worsening sx COVD test is pending

## 2021-01-11 NOTE — ED Provider Notes (Signed)
[facility]    CSN: [id number] Arrival date & time: 01/11/21  1651      History   Chief Complaint Chief Complaint  Patient presents with   Shortness of Breath   Cough   Sore Throat    HPI Maurice Swanson is a 60 y.o. male.   Pt complains of productive cough, congestion, sore throat that started about 4 days ago.  He reports with climbing a ladder earlier today he began to get winded.  He denies chest pain, shortness of breath, n/v/d.  He has been taking nothing for the sx.  He did have a COVID vaccine.   Past Medical History:  Diagnosis Date   Arthritis    Humerus fracture    left   Wears partial dentures     Patient Active Problem List   Diagnosis Date Noted   Submental abscess 11/10/2018   Closed displaced oblique fracture of shaft of left humerus 10/02/2018    Past Surgical History:  Procedure Laterality Date   HIP SURGERY     INCISION AND DRAINAGE ABSCESS N/A 11/10/2018   Procedure: INCISION AND DRAINAGE NECK ABSCESS;  Surgeon: Christia Reading, MD;  Location: [facility] OR;  Service: ENT;  Laterality: N/A;   MULTIPLE TOOTH EXTRACTIONS     SHOULDER CLOSED REDUCTION Left 10/02/2018   Procedure: manipulation of shoulder and elbow;  Surgeon: Yolonda Kida, MD;  Location: [facility] OR;  Service: Orthopedics;  Laterality: Left;       Home Medications    Prior to Admission medications   Medication Sig Start Date End Date Taking? Authorizing Provider  amoxicillin-clavulanate (AUGMENTIN) 875-125 MG tablet Take 1 tablet by mouth every 12 (twelve) hours. 12/20/20   Wallis Bamberg, PA-C  clindamycin (CLEOCIN) 300 MG capsule Take 1 capsule (300 mg total) by mouth 3 (three) times daily. Patient not taking: Reported on 07/09/2019 11/11/18   Christia Reading, MD  ibuprofen (ADVIL) 600 MG tablet Take 1 tablet (600 mg total) by mouth every 6 (six) hours as needed. Patient not taking: Reported on 07/09/2019 11/30/18   Jacalyn Lefevre, MD  naproxen (NAPROSYN) 500 MG tablet Take 1  tablet (500 mg total) by mouth 2 (two) times daily with a meal. 12/20/20   Wallis Bamberg, PA-C  silver sulfADIAZINE (SILVADENE) 1 % cream Apply 1 application topically daily. 12/20/20   Wallis Bamberg, PA-C  traMADol (ULTRAM) 50 MG tablet Take 1 tablet (50 mg total) by mouth every 6 (six) hours as needed. 07/09/19   Geoffery Lyons, MD    Family History Family History  Problem Relation Age of Onset   Hypertension Mother     Social History Social History   Tobacco Use   Smoking status: Every Day    Packs/day: 0.50    Years: 35.00    Pack years: 17.50    Types: Cigarettes   Smokeless tobacco: Never  Vaping Use   Vaping Use: Never used  Substance Use Topics   Alcohol use: No   Drug use: No    Types: Cocaine    Comment: Denies 10/01/2018     Allergies   Patient has no known allergies.   Review of Systems Review of Systems  Constitutional:  Positive for fever. Negative for chills.  HENT:  Positive for congestion. Negative for ear pain and sore throat.   Eyes:  Negative for pain and visual disturbance.  Respiratory:  Positive for cough. Negative for shortness of breath.   Cardiovascular:  Negative for chest pain and palpitations.  Gastrointestinal:  Negative for abdominal pain and vomiting.  Genitourinary:  Negative for dysuria and hematuria.  Musculoskeletal:  Negative for arthralgias and back pain.  Skin:  Negative for color change and rash.  Neurological:  Negative for seizures and syncope.  All other systems reviewed and are negative.   Physical Exam Triage Vital Signs ED Triage Vitals  Enc Vitals Group     BP 01/11/21 1752 (!) 147/95     Pulse Rate 01/11/21 1752 74     Resp 01/11/21 1752 16     Temp 01/11/21 1752 98.3 F (36.8 C)     Temp Source 01/11/21 1752 Oral     SpO2 01/11/21 1752 93 %     Weight --      Height --      Head Circumference --      Peak Flow --      Pain Score 01/11/21 1753 5     Pain Loc --      Pain Edu? --      Excl. in GC? --    No data  found.  Updated Vital Signs BP (!) 147/95 (BP Location: Left Arm)   Pulse 74   Temp 98.3 F (36.8 C) (Oral)   Resp 16   SpO2 96%   Visual Acuity Right Eye Distance:   Left Eye Distance:   Bilateral Distance:    Right Eye Near:   Left Eye Near:    Bilateral Near:     Physical Exam Vitals and nursing note reviewed.  Constitutional:      Appearance: He is well-developed.  HENT:     Head: Normocephalic and atraumatic.  Eyes:     Conjunctiva/sclera: Conjunctivae normal.  Cardiovascular:     Rate and Rhythm: Normal rate and regular rhythm.     Heart sounds: No murmur heard. Pulmonary:     Effort: Pulmonary effort is normal. No respiratory distress.     Breath sounds: Normal breath sounds.  Abdominal:     Palpations: Abdomen is soft.     Tenderness: There is no abdominal tenderness.  Musculoskeletal:     Cervical back: Neck supple.  Skin:    General: Skin is warm and dry.  Neurological:     Mental Status: He is alert.     UC Treatments / Results  Labs (all labs ordered are listed, but only abnormal results are displayed) Labs Reviewed  NOVEL CORONAVIRUS, NAA    EKG   Radiology No results found.  Procedures Procedures (including critical care time)  Medications Ordered in UC Medications - No data to display  Initial Impression / Assessment and Plan / UC Course  I have reviewed the triage vital signs and the nursing notes.  Pertinent labs & imaging results that were available during my care of the patient were reviewed by me and considered in my medical decision making (see chart for details).     URI, COVID test pending. Vitals wnl, pt well appearing, stable for discharge.  EKG unchanged.  Will prescribe cough syrup, advise flonase and mucinex.  Strict return precautions discussed.  Final Clinical Impressions(s) / UC Diagnoses   Final diagnoses:  Encounter for screening laboratory testing for COVID-19 virus   Discharge Instructions   None    ED  Prescriptions   None    PDMP not reviewed this encounter.   Ward, Tylene Fantasia, PA-C 01/11/21 1821

## 2021-01-11 NOTE — ED Triage Notes (Signed)
C/o SOB, chest tightness, runny nose, cough, sore throat, chills x 4 days. Wants to be tested for Covid

## 2021-01-12 LAB — NOVEL CORONAVIRUS, NAA: SARS-CoV-2, NAA: NOT DETECTED

## 2021-01-12 LAB — SARS-COV-2, NAA 2 DAY TAT

## 2022-02-15 IMAGING — DX DG CHEST 2V
2 series · 2 of 2 positions shown · non-contrast
Comparison: 11/30/2018

CLINICAL DATA: Chest pain radiating to the back, shortness of
breath for 1 day

EXAM:
CHEST - 2 VIEW

[chest pa]
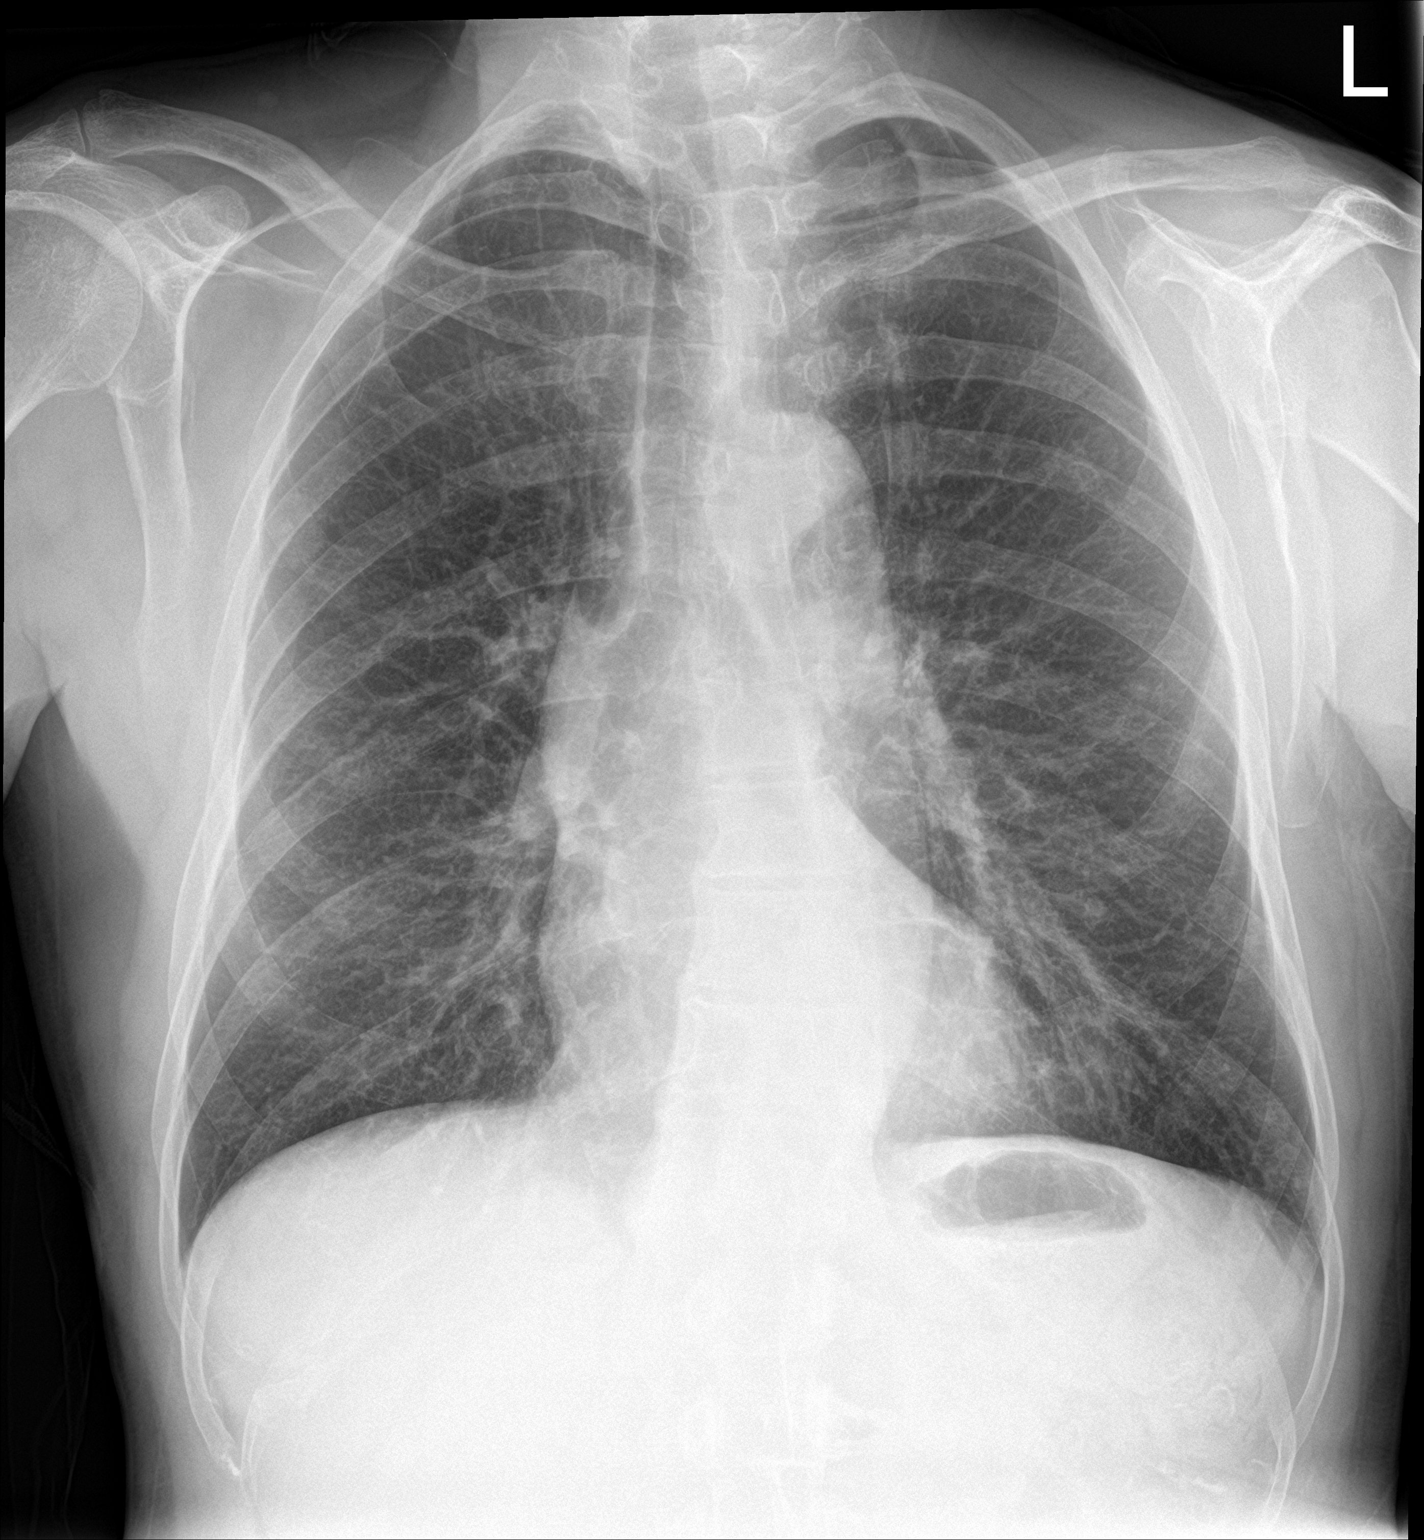

[chest lat]
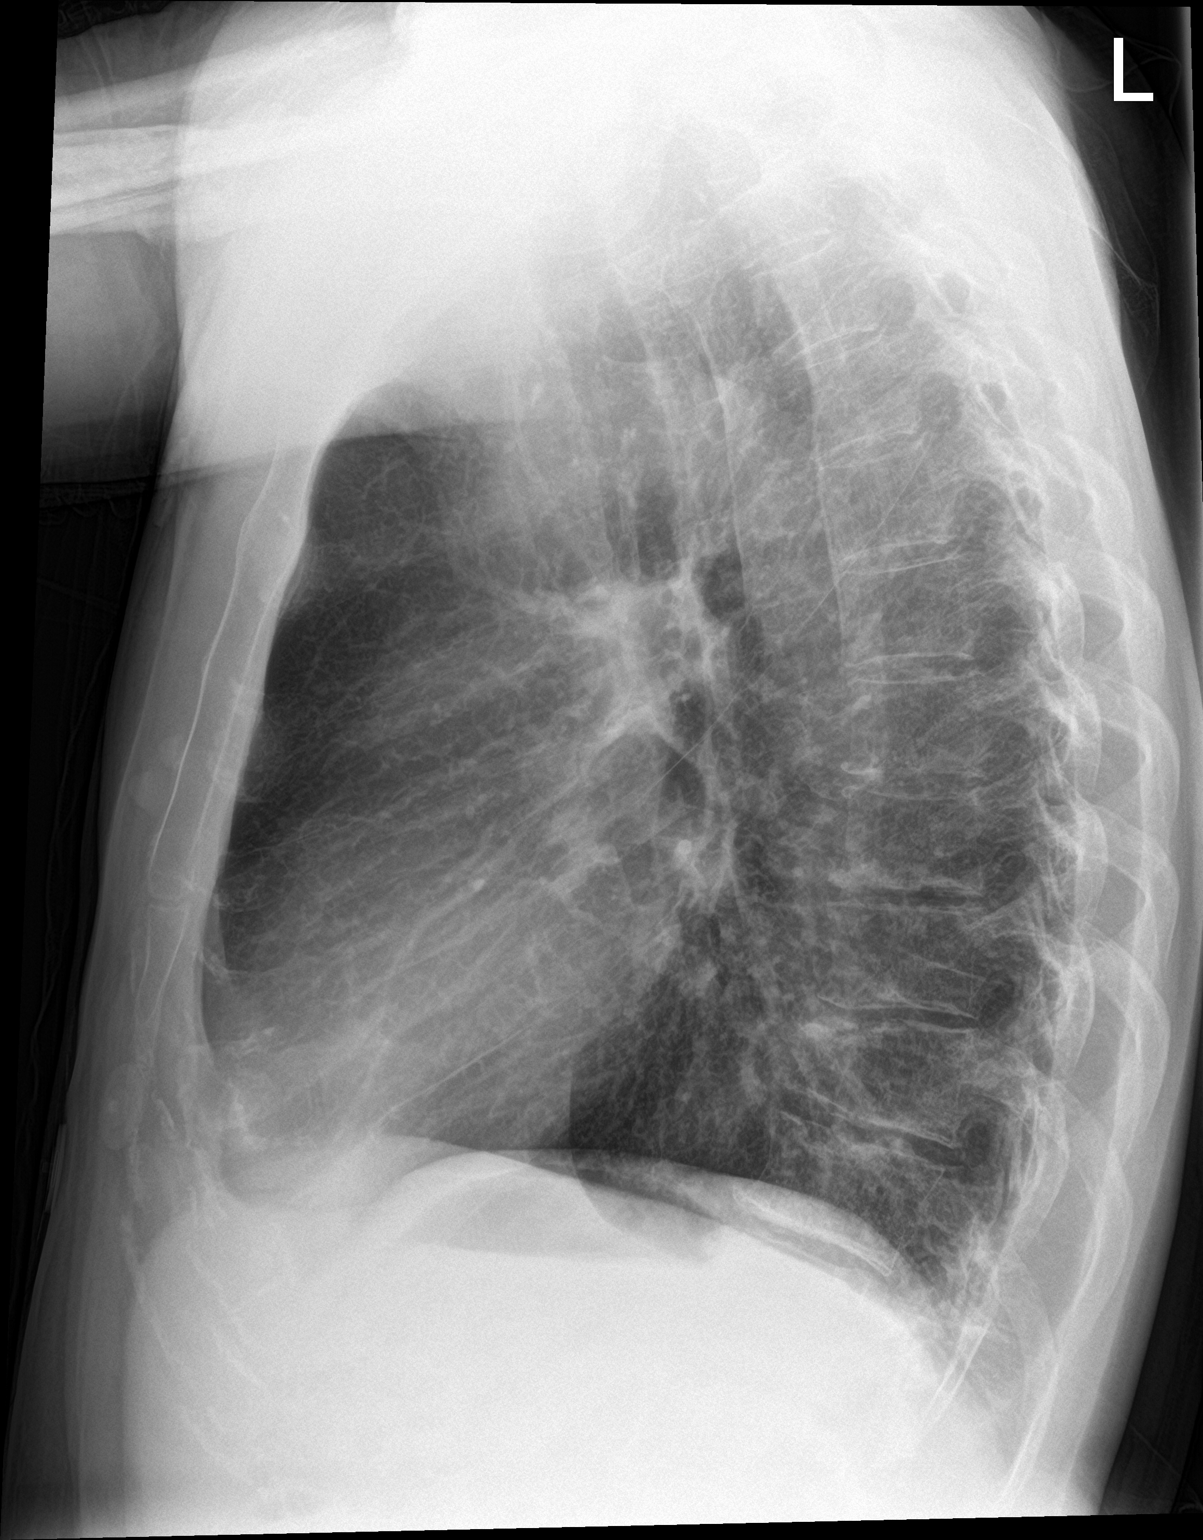

[2 of 2 positions shown; findings below may reference images not displayed]

FINDINGS: Frontal and lateral views of the chest demonstrate an unremarkable
cardiac silhouette. No airspace disease, effusion, or pneumothorax.
There is a 2 cm nodular density projecting over the right anterior
third rib, which could reflect pulmonary nodule. Nonemergent CT
chest without contrast recommended for follow-up. No acute bony
abnormalities.
IMPRESSION: 1. Possible 2 cm right upper lobe pulmonary nodule. Nonemergent
follow-up CT chest without contrast recommended.
2. No acute airspace disease.

## 2022-02-15 IMAGING — CT CT ANGIO CHEST-ABD-PELV FOR DISSECTION W/ AND WO/W CM
2 of 7 series · 12 of 46 positions shown, 14 images · IV contrast (APPLIED)
Comparison: 07/09/2019, 04/16/2009

CLINICAL DATA: Short of breath, chest pain radiating to back for 1
day

EXAM:
CT ANGIOGRAPHY CHEST, ABDOMEN AND PELVIS
TECHNIQUE: Multidetector CT imaging through the chest, abdomen and pelvis was
performed using the standard protocol during bolus administration of
intravenous contrast. Multiplanar reconstructed images and MIPs were
obtained and reviewed to evaluate the vascular anatomy.
CONTRAST:  100mL OMNIPAQUE IOHEXOL 350 MG/ML SOLN

[Series 7: arterial · axial · arterial · 0.80mm/px · z∈[+868,+1474]mm · 9 of 360 slices shown, 11 images]
[im 38/360  soft-tissue]
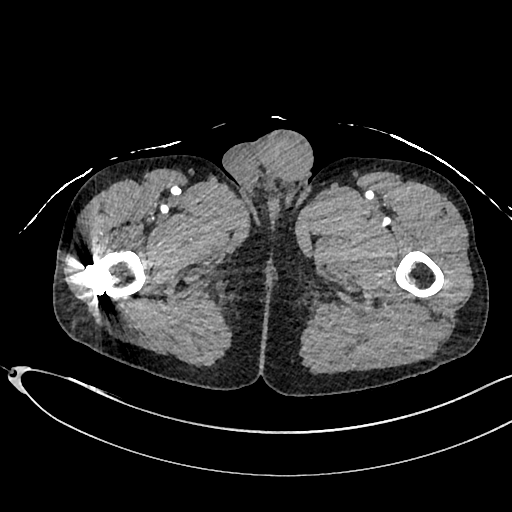
[im 38/360  bone]
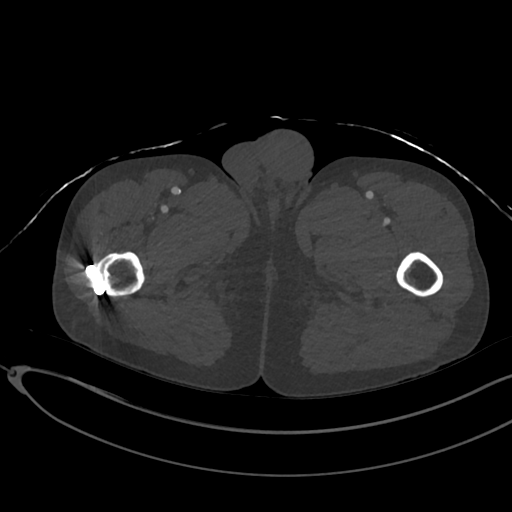
[im 76/360  soft-tissue]
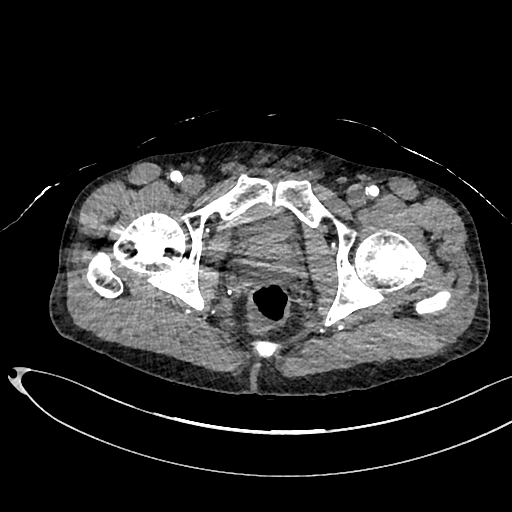
[im 114/360  soft-tissue]
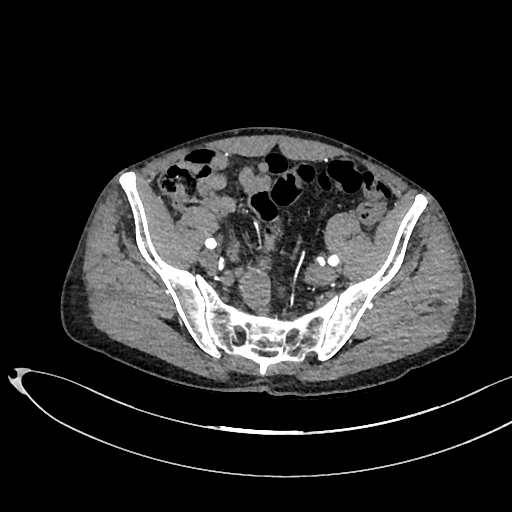
[im 152/360  soft-tissue]
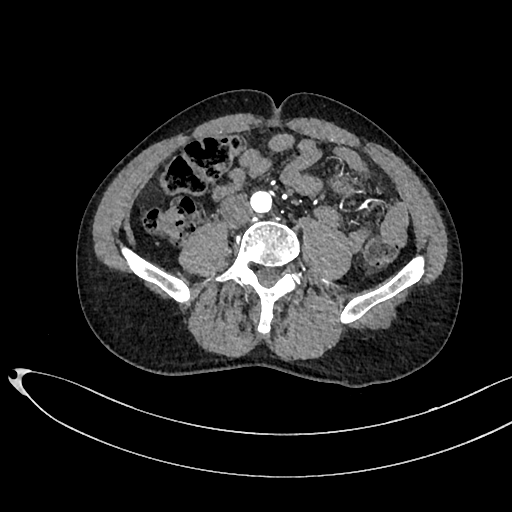
[im 189/360  soft-tissue]
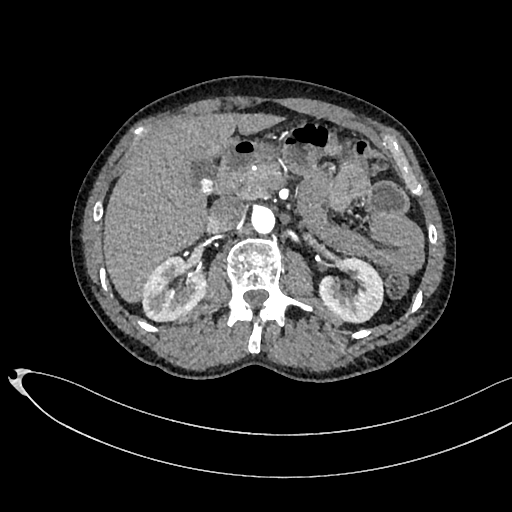
[im 227/360  soft-tissue]
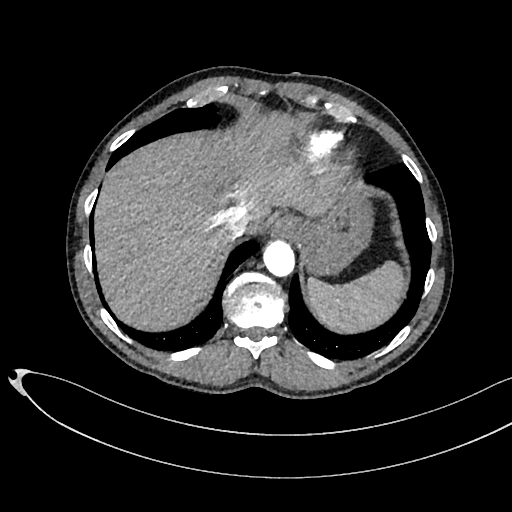
[im 265/360  soft-tissue]
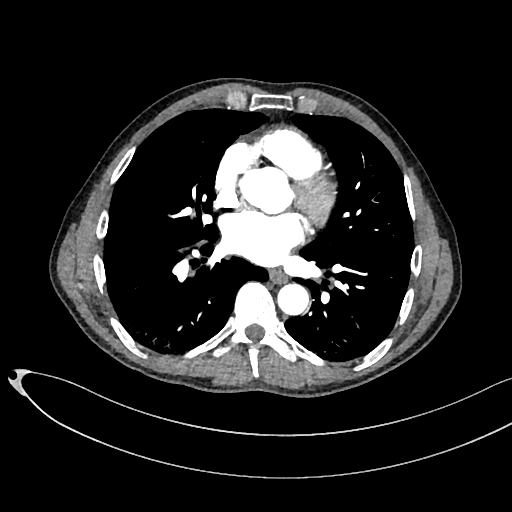
[im 303/360  soft-tissue]
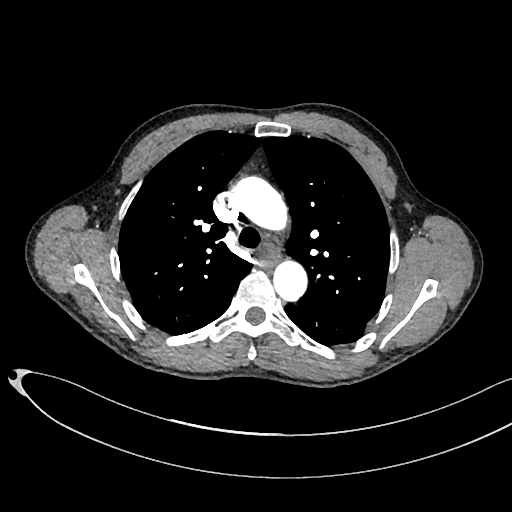
[im 341/360  soft-tissue]
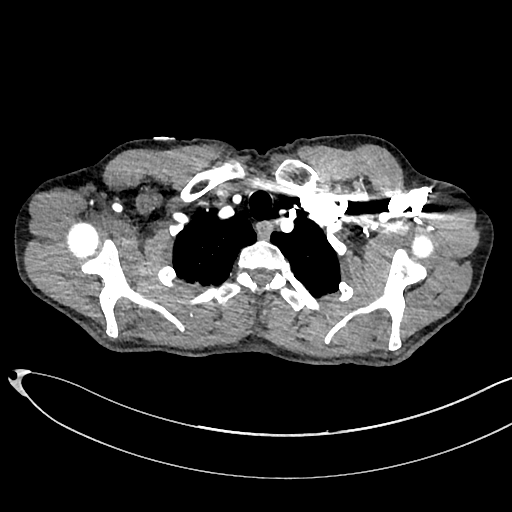
[im 341/360  bone]
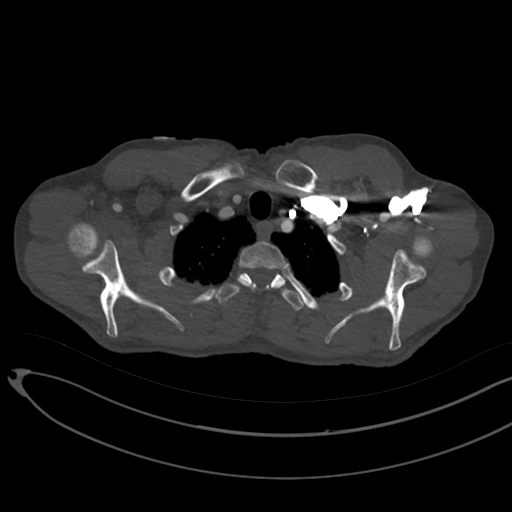

[Series 10: cor · coronal · 0.72mm/px · 3 of 140 slices shown]
[im 35/140  soft-tissue]
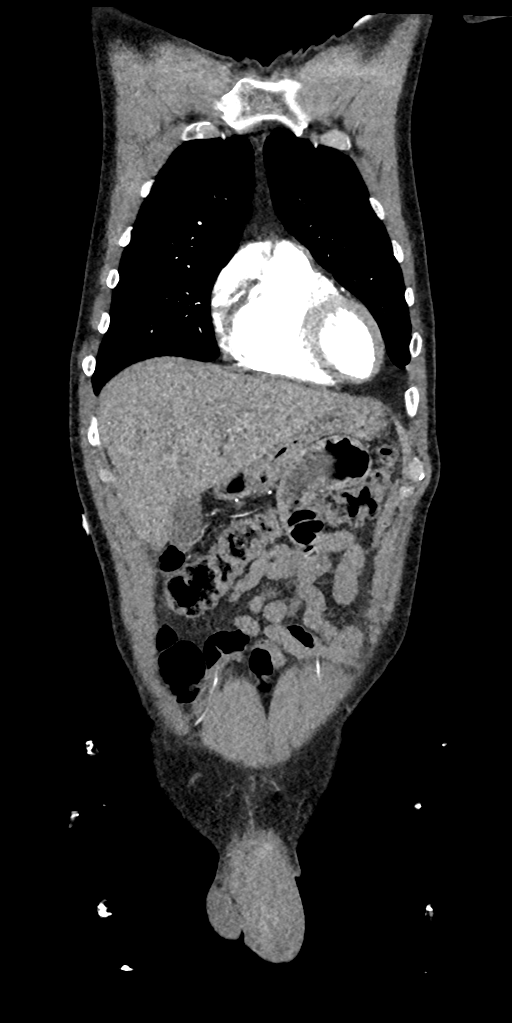
[im 70/140  soft-tissue]
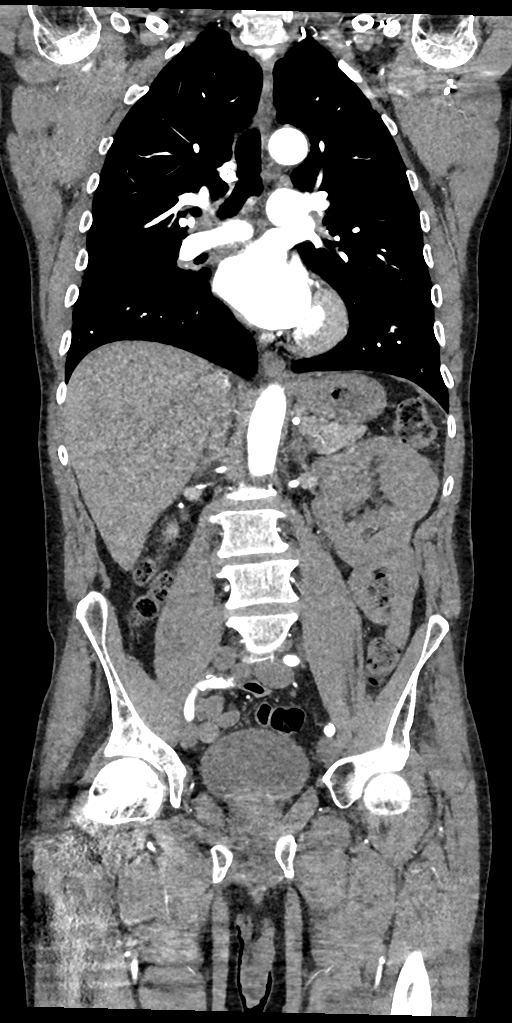
[im 105/140  soft-tissue]
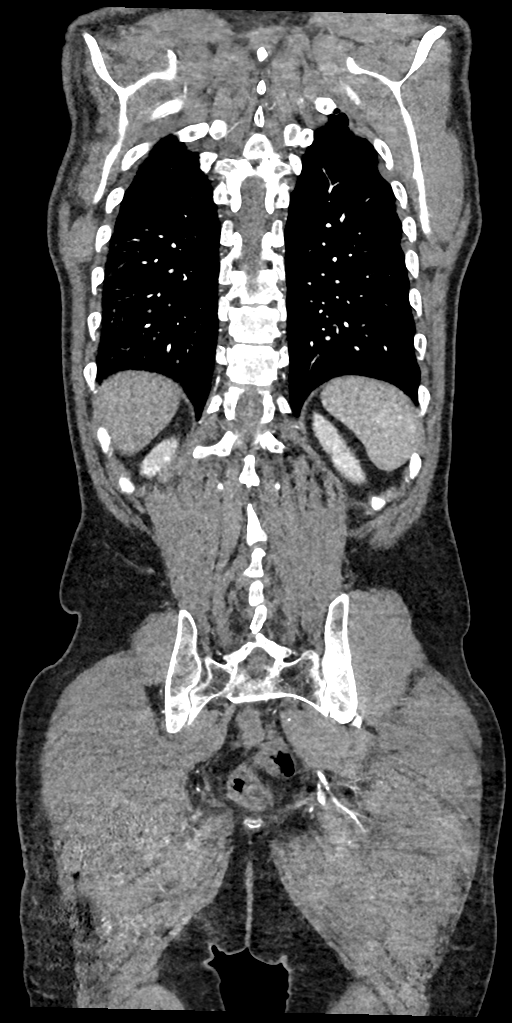

[12 of 46 positions shown; findings below may reference images not displayed]

FINDINGS: CTA CHEST FINDINGS

Cardiovascular: Mild dilation of the ascending thoracic aorta
measuring 3.9 cm. No evidence of dissection. Aortic arch and
descending thoracic aorta are unremarkable.

While not optimized for opacification of the pulmonary vasculature,
there is sufficient contrast enhancement to exclude pulmonary
emboli.

The heart is unremarkable without pericardial effusion.

Mediastinum/Nodes: No enlarged mediastinal, hilar, or axillary lymph
nodes. Thyroid gland, trachea, and esophagus demonstrate no
significant findings.

Lungs/Pleura: Moderate upper lobe predominant emphysema. No acute
airspace disease, effusion, or pneumothorax. Hypoventilatory changes
are seen at the lung bases. 6 mm left upper lobe pulmonary nodule
reference image 80 and 6 mm subpleural left lower lobe pulmonary
No other pulmonary nodules or masses. Central airways are patent.

Musculoskeletal: There are no acute or destructive bony lesions.
Reconstructed images demonstrate no additional findings.

Review of the MIP images confirms the above findings.

CTA ABDOMEN AND PELVIS FINDINGS

VASCULAR

Aorta: Normal caliber aorta without aneurysm, dissection, vasculitis
or significant stenosis.

Celiac: Patent without evidence of aneurysm, dissection, vasculitis
or significant stenosis.

SMA: The SMA is widely patent. There is a 0.8 by 0.7 by 0.8 cm
aneurysm of the first posterior branch of the SMA, reference axial
image 175 of series 7 and sagittal image 105 of series 11.

Renals: There are 2 right renal arteries and a single left renal
artery. No stenosis or dissection.

IMA: Patent without evidence of aneurysm, dissection, vasculitis or
significant stenosis.

Inflow: Patent without evidence of aneurysm, dissection, vasculitis
or significant stenosis.

Veins: No obvious venous abnormality within the limitations of this
arterial phase study.

Review of the MIP images confirms the above findings.

NON-VASCULAR

Hepatobiliary: Calcified gallstone without cholecystitis. The liver
is unremarkable.

Pancreas: Unremarkable. No pancreatic ductal dilatation or
surrounding inflammatory changes.

Spleen: Normal in size without focal abnormality.

Adrenals/Urinary Tract: Adrenal glands are unremarkable. Kidneys are
normal, without renal calculi, focal lesion, or hydronephrosis.
Bladder is unremarkable.

Stomach/Bowel: No high-grade bowel obstruction. Short segment of
dilated jejunum in the left upper quadrant measures up to 3.3 cm,
which could reflect localized ileus. Normal appendix. No
inflammatory changes.

Lymphatic: No pathologic adenopathy within the abdomen or pelvis.

Reproductive: Prostate is unremarkable.

Other: No abdominal wall hernia or abnormality. No abdominopelvic
ascites.

Musculoskeletal: No acute or destructive bony lesions. Bilateral
pars defects at L5 without significant spondylolisthesis. Chronic
anterior wedging at the thoracolumbar junction. Reconstructed images
demonstrate no additional findings.

Review of the MIP images confirms the above findings.
IMPRESSION: 1. Mild dilation of the ascending thoracic aorta measuring up to
cm. No evidence of dissection.
2. Small 0.8 x 0.7 by 0.8 cm aneurysm of the first posterior branch
of the SMA.
3. Cholelithiasis without cholecystitis.
4. Short segment of dilated jejunum in the left upper quadrant
measures up to 3.3 cm, which could reflect localized ileus. No
high-grade bowel obstruction.
5. Emphysema ([id number]).

## 2022-03-12 ENCOUNTER — Emergency Department (HOSPITAL_COMMUNITY): Payer: 59

## 2022-03-12 ENCOUNTER — Other Ambulatory Visit: Payer: Self-pay

## 2022-03-12 ENCOUNTER — Encounter (HOSPITAL_COMMUNITY): Payer: Self-pay

## 2022-03-12 ENCOUNTER — Emergency Department (HOSPITAL_COMMUNITY)
Admission: EM | Admit: 2022-03-12 | Discharge: 2022-03-12 | Disposition: A | Discharge status: Home / Self Care | Payer: 59 | Attending: Emergency Medicine | Admitting: Emergency Medicine

## 2022-03-12 DIAGNOSIS — R0981 Nasal congestion: Secondary | ICD-10-CM | POA: Diagnosis present

## 2022-03-12 DIAGNOSIS — R197 Diarrhea, unspecified: Secondary | ICD-10-CM | POA: Insufficient documentation

## 2022-03-12 DIAGNOSIS — I2510 Atherosclerotic heart disease of native coronary artery without angina pectoris: Secondary | ICD-10-CM | POA: Diagnosis not present

## 2022-03-12 DIAGNOSIS — Z20822 Contact with and (suspected) exposure to covid-19: Secondary | ICD-10-CM | POA: Diagnosis not present

## 2022-03-12 DIAGNOSIS — R1110 Vomiting, unspecified: Secondary | ICD-10-CM | POA: Diagnosis not present

## 2022-03-12 DIAGNOSIS — R911 Solitary pulmonary nodule: Secondary | ICD-10-CM | POA: Insufficient documentation

## 2022-03-12 DIAGNOSIS — J069 Acute upper respiratory infection, unspecified: Secondary | ICD-10-CM | POA: Insufficient documentation

## 2022-03-12 DIAGNOSIS — I2584 Coronary atherosclerosis due to calcified coronary lesion: Secondary | ICD-10-CM | POA: Insufficient documentation

## 2022-03-12 LAB — COMPREHENSIVE METABOLIC PANEL
ALT: 14 U/L (ref 0–44)
AST: 17 U/L (ref 15–41)
Albumin: 3.5 g/dL (ref 3.5–5.0)
Alkaline Phosphatase: 108 U/L (ref 38–126)
Anion gap: 6 (ref 5–15)
BUN: 13 mg/dL (ref 6–20)
CO2: 26 mmol/L (ref 22–32)
Calcium: 9.3 mg/dL (ref 8.9–10.3)
Chloride: 106 mmol/L (ref 98–111)
Creatinine, Ser: 1.04 mg/dL (ref 0.61–1.24)
GFR, Estimated: >60 mL/min (ref >60)
Glucose, Bld: 101 mg/dL — ABNORMAL HIGH (ref 70–99)
Potassium: 4.4 mmol/L (ref 3.5–5.1)
Sodium: 138 mmol/L (ref 135–145)
Total Bilirubin: 0.6 mg/dL (ref 0.3–1.2)
Total Protein: 7.9 g/dL (ref 6.5–8.1)

## 2022-03-12 LAB — RESP PANEL BY RT-PCR (FLU A&B, COVID) ARPGX2
Influenza A by PCR: NEGATIVE
Influenza B by PCR: NEGATIVE
SARS Coronavirus 2 by RT PCR: NEGATIVE

## 2022-03-12 LAB — CBC
HCT: 39.1 % (ref 39.0–52.0)
Hemoglobin: 12.7 g/dL — ABNORMAL LOW (ref 13.0–17.0)
MCH: 29.6 pg (ref 26.0–34.0)
MCHC: 32.5 g/dL (ref 30.0–36.0)
MCV: 91.1 fL (ref 80.0–100.0)
Platelets: 337 10*3/uL (ref 150–400)
RBC: 4.29 MIL/uL (ref 4.22–5.81)
RDW: 14.1 % (ref 11.5–15.5)
WBC: 6.4 10*3/uL (ref 4.0–10.5)
nRBC: 0 % (ref 0.0–0.2)

## 2022-03-12 LAB — URINALYSIS, ROUTINE W REFLEX MICROSCOPIC
Bilirubin Urine: NEGATIVE
Glucose, UA: NEGATIVE mg/dL
Hgb urine dipstick: NEGATIVE
Ketones, ur: NEGATIVE mg/dL
Leukocytes,Ua: NEGATIVE
Nitrite: NEGATIVE
Protein, ur: NEGATIVE mg/dL
Specific Gravity, Urine: 1.018 (ref 1.005–1.030)
pH: 5 (ref 5.0–8.0)

## 2022-03-12 LAB — LIPASE, BLOOD: Lipase: 35 U/L (ref 11–51)

## 2022-03-12 NOTE — ED Provider Notes (Signed)
[facility] DEPT Provider Note   CSN: [id number] Arrival date & time: 03/12/22  1117     History  Chief Complaint  Patient presents with   Emesis    Maurice Swanson is a 61 y.o. male who presents to the emergency department with concerns for emesis onset 3 days.  Has sick contacts at work with [employer], patient notes that he would like to be tested for COVID at this time.  Has associated generalized weakness, diarrhea, rhinorrhea, nasal congestion.  No measured prior to arrival.  Denies sore throat, painful swallowing, trouble swallowing, nausea, abdominal pain.  The history is provided by the patient. No language interpreter was used.       Home Medications Prior to Admission medications   Medication Sig Start Date End Date Taking? Authorizing Provider  amoxicillin-clavulanate (AUGMENTIN) 875-125 MG tablet Take 1 tablet by mouth every 12 (twelve) hours. 12/20/20   Jaynee Eagles, PA-C  clindamycin (CLEOCIN) 300 MG capsule Take 1 capsule (300 mg total) by mouth 3 (three) times daily. Patient not taking: Reported on 07/09/2019 11/11/18   Melida Quitter, MD  ibuprofen (ADVIL) 600 MG tablet Take 1 tablet (600 mg total) by mouth every 6 (six) hours as needed. Patient not taking: Reported on 07/09/2019 11/30/18   Isla Pence, MD  naproxen (NAPROSYN) 500 MG tablet Take 1 tablet (500 mg total) by mouth 2 (two) times daily with a meal. 12/20/20   Jaynee Eagles, PA-C  promethazine-dextromethorphan (PROMETHAZINE-DM) 6.25-15 MG/5ML syrup Take 5 mLs by mouth 4 (four) times daily as needed for cough. 01/11/21   Ward, Lenise Arena, PA-C  silver sulfADIAZINE (SILVADENE) 1 % cream Apply 1 application topically daily. 12/20/20   Jaynee Eagles, PA-C  traMADol (ULTRAM) 50 MG tablet Take 1 tablet (50 mg total) by mouth every 6 (six) hours as needed. 07/09/19   Veryl Speak, MD      Allergies    Patient has no known allergies.    Review of Systems   Review of Systems  Gastrointestinal:   Positive for vomiting.  All other systems reviewed and are negative.   Physical Exam Updated Vital Signs BP (!) 135/106   Pulse 60   Temp 98.3 F (36.8 C)   Resp 17   Ht 5\' 11"  (1.803 m)   Wt 78 kg   SpO2 97%   BMI 23.99 kg/m  Physical Exam Vitals and nursing note reviewed.  Constitutional:      General: He is not in acute distress.    Appearance: He is not diaphoretic.  HENT:     Head: Normocephalic and atraumatic.     Mouth/Throat:     Pharynx: No oropharyngeal exudate.  Eyes:     General: No scleral icterus.    Conjunctiva/sclera: Conjunctivae normal.  Cardiovascular:     Rate and Rhythm: Normal rate and regular rhythm.     Pulses: Normal pulses.     Heart sounds: Normal heart sounds.  Pulmonary:     Effort: Pulmonary effort is normal. No respiratory distress.     Breath sounds: Normal breath sounds. No wheezing.  Abdominal:     General: Bowel sounds are normal.     Palpations: Abdomen is soft. There is no mass.     Tenderness: There is no abdominal tenderness. There is no guarding or rebound.  Musculoskeletal:        General: Normal range of motion.     Cervical back: Normal range of motion and neck supple.  Skin:  General: Skin is warm and dry.  Neurological:     Mental Status: He is alert.  Psychiatric:        Behavior: Behavior normal.     ED Results / Procedures / Treatments   Labs (all labs ordered are listed, but only abnormal results are displayed) Labs Reviewed  COMPREHENSIVE METABOLIC PANEL - Abnormal; Notable for the following components:      Result Value   Glucose, Bld 101 (*)    All other components within normal limits  CBC - Abnormal; Notable for the following components:   Hemoglobin 12.7 (*)    All other components within normal limits  RESP PANEL BY RT-PCR (FLU A&B, COVID) ARPGX2  LIPASE, BLOOD  URINALYSIS, ROUTINE W REFLEX MICROSCOPIC    EKG  viral URI with cough.EKG Interpretation  Date/Time:  Saturday March 12 2022  21:37:25 EDT Ventricular Rate:  60 PR Interval:  144 QRS Duration: 87 QT Interval:  422 QTC Calculation: 422 R Axis:   64 Text Interpretation: Sinus rhythm Consider left atrial enlargement Probable anteroseptal infarct, old No significant change since last tracing Confirmed by Richardean Canal ([id number]) on 03/12/2022 9:39:50 PM  Radiology CT Chest Wo Contrast  Result Date: 03/12/2022 CLINICAL DATA:  Abnormal chest radiograph.  Pulmonary nodule EXAM: CT CHEST WITHOUT CONTRAST TECHNIQUE: Multidetector CT imaging of the chest was performed following the standard protocol without IV contrast. RADIATION DOSE REDUCTION: This exam was performed according to the departmental dose-optimization program which includes automated exposure control, adjustment of the mA and/or kV according to patient size and/or use of iterative reconstruction technique. COMPARISON:  Chest radiograph dated 03/12/2022. Chest CT dated 07/09/2019 and 04/16/2009. FINDINGS: Evaluation of this exam is limited in the absence of intravenous contrast. Cardiovascular: There is no cardiomegaly or pericardial effusion. There is 3 vessel coronary vascular calcification. Mild atherosclerotic calcification of the thoracic aorta. No aneurysmal dilatation. The central pulmonary arteries are grossly unremarkable. Mediastinum/Nodes: No hilar or mediastinal adenopathy. Small amount of fluid old in the mid esophagus may represent reflux or delayed clearance. No mediastinal fluid collection. Lungs/Pleura: There is mild centrilobular emphysema. Several bilateral pulmonary nodules measure up to 7 mm in the left lower lobe along the fissure (79/7). These nodules are relatively stable or minimally increased since the study of 2010 consistent with benign or indolent process. No focal consolidation, pleural effusion, or pneumothorax. The central airways are patent. Upper Abdomen: Gallstone. Musculoskeletal: Osteopenia with degenerative changes of the spine. No acute  osseous pathology. IMPRESSION: 1. No acute intrathoracic pathology. 2. Multiple pulmonary nodules relatively stable since the study of 2010 consistent with benign or indolent process. 3. Cholelithiasis. 4. Three-vessel coronary vascular calcification. 5. Aortic Atherosclerosis (ICD10-I70.0) and Emphysema (ICD10-J43.9). Electronically Signed   By: Elgie Collard M.D.   On: 03/12/2022 20:45   DG Chest 2 View  Result Date: 03/12/2022 CLINICAL DATA:  Cough. EXAM: CHEST - 2 VIEW COMPARISON:  07/09/2019 FINDINGS: The lungs are clear without focal pneumonia, edema, pneumothorax or pleural effusion. The cardiopericardial silhouette is within normal limits for size. Symmetric nodular densities over each lower lobe are compatible with nipple shadows. There is an additional tiny nodule projecting over the anterior left fourth rib. No worrisome lytic or sclerotic osseous abnormality. IMPRESSION: 1. No acute cardiopulmonary findings. 2. Tiny nodule projecting over the anterior left fourth rib. CT chest without contrast recommended to further evaluate. Electronically Signed   By: Kennith Center M.D.   On: 03/12/2022 19:20    Procedures Procedures  Medications Ordered in ED Medications - No data to display  ED Course/ Medical Decision Making/ A&P Clinical Course as of 03/12/22 2200  Sat Mar 12, 2022  2002 Discussed with patient CXR findings, also discussed with patient recommendations per radiologist for CT chest wo. Patient agreeable at this time. Pt notes that he typically smokes 0.5 PPD. [SB]    Clinical Course User Index [SB] Sulamita Lafountain A, PA-C                           Medical Decision Making Amount and/or Complexity of Data Reviewed Labs: ordered. Radiology: ordered.   Pt presents with concerns for emesis onset 3 days.  Has sick contacts at work with COVID.  Patient notes that he like to be tested for COVID at this time.  Patient afebrile.  On exam patient without acute cardiovascular,  respiratory, down exam findings.  Differential diagnosis includes COVID, flu, viral URI with cough, pneumonia.    Labs:  I ordered, and personally interpreted labs.  The pertinent results include:   Lipase unremarkable CBC without leukocytosis CMP without concerning findings COVID swab negative Flu swab negative  Imaging: I ordered imaging studies including Chest x-ray and CT chest wo I independently visualized and interpreted imaging which showed: CXR with concerns for tiny nodule. CT chest with concerns for  1. No acute intrathoracic pathology.  2. Multiple pulmonary nodules relatively stable since the study of  2010 consistent with benign or indolent process.  3. Cholelithiasis.  4. Three-vessel coronary vascular calcification.  5. Aortic Atherosclerosis (ICD10-I70.0) and Emphysema (ICD10-J43.9).   I agree with the radiologist interpretation   Disposition: Presentation suspicious for viral URI with cough.  Also concerns for pulmonary nodule.  Concerns for three-vessel disease as noted on CT scan.  Doubt COVID, flu at this time.  Doubt concerns at this time for intra-abdominal abnormality.  Patient voiced that he was here primarily for a COVID test.. After consideration of the diagnostic results and the patients response to treatment, I feel that the patient would benefit from Discharge home.  Provided information for pulmonologist as well as ambulatory referral to cardiology.  Instructed to follow-up with his primary care provider.  Supportive care measures and strict return precautions discussed with patient at bedside. Pt acknowledges and verbalizes understanding. Pt appears safe for discharge. Follow up as indicated in discharge paperwork.    This chart was dictated using voice recognition software, Dragon. Despite the best efforts of this provider to proofread and correct errors, errors may still occur which can change documentation meaning.  Final Clinical Impression(s) / ED  Diagnoses Final diagnoses:  Pulmonary nodule  Viral URI with cough  Coronary artery calcification seen on CAT scan    Rx / DC Orders ED Discharge Orders          Ordered    Ambulatory referral to Cardiology       Comments: If you have not heard from the Cardiology office within the next 72 hours please call [phone].   03/12/22 2148              Stanisha Lorenz A, PA-C 03/12/22 2201    Ernie Avena, MD 03/12/22 2254

## 2022-03-12 NOTE — ED Triage Notes (Addendum)
Patient has been vomiting for 3 days. Feeling weak and having diarrhea. Said someone at work has covid and he wants a test now too.

## 2022-03-12 NOTE — Discharge Instructions (Addendum)
It was a pleasure taking care of you today!   Your labs did not show any concerning findings today.  Your chest x-ray showed concerns for a nodule to your lung.  This is again noted on your CT scan, however there was noted as stable from previous values.  Your COVID and flu swabs are negative today.  Attached is information for the on-call pulmonologist to follow-up regarding today's ED visit.  An ambulatory referral has been placed to cardiology, they will call you to set up a follow-up appointment regarding today's ED visit.  Call them if you have not heard from them by 03/17/2022.  You may follow-up with your primary care provider as needed.  Return to emergency department for experiencing increasing/worsening symptoms.

## 2022-03-15 ENCOUNTER — Ambulatory Visit: Payer: 59 | Admitting: Cardiology

## 2022-03-29 ENCOUNTER — Ambulatory Visit: Payer: 59 | Admitting: Pulmonary Disease

## 2022-03-29 ENCOUNTER — Encounter: Payer: Self-pay | Admitting: Pulmonary Disease

## 2022-03-29 VITALS — BP 132/76 | HR 64 | Ht 71.0 in | Wt 168.4 lb

## 2022-03-29 DIAGNOSIS — F17210 Nicotine dependence, cigarettes, uncomplicated: Secondary | ICD-10-CM

## 2022-03-29 DIAGNOSIS — J432 Centrilobular emphysema: Secondary | ICD-10-CM

## 2022-03-29 MED ORDER — STIOLTO RESPIMAT 2.5-2.5 MCG/ACT IN AERS: 2.0000 | INHALATION_SPRAY | Freq: Every day | RESPIRATORY_TRACT | 3 refills | Status: DC

## 2022-03-29 MED ORDER — PREDNISONE 20 MG PO TABS: 20.0000 mg | ORAL_TABLET | Freq: Every day | ORAL | 0 refills | Status: AC

## 2022-03-29 NOTE — Progress Notes (Unsigned)
@Patient  ID: , male    DOB: [birth year], 61 y.o.   MRN: [id number]  Chief Complaint  Patient presents with   Consult    Pt is here for consult for a nodule on his lung. Pt states that he had CT scan done at the hospital.     Referring provider: No ref. provider found  HPI:   PMH:  Smoker/ Smoking History:  Maintenance:   Pt of:   03/29/2022  - Visit     Questionaires / Pulmonary Flowsheets:   ACT:      No data to display          MMRC:     No data to display          Epworth:      No data to display          Tests:   FENO:  No results found for: "NITRICOXIDE"  PFT:     No data to display          WALK:      No data to display          Imaging: CT Chest Wo Contrast  Result Date: 03/12/2022 CLINICAL DATA:  Abnormal chest radiograph.  Pulmonary nodule EXAM: CT CHEST WITHOUT CONTRAST TECHNIQUE: Multidetector CT imaging of the chest was performed following the standard protocol without IV contrast. RADIATION DOSE REDUCTION: This exam was performed according to the departmental dose-optimization program which includes automated exposure control, adjustment of the mA and/or kV according to patient size and/or use of iterative reconstruction technique. COMPARISON:  Chest radiograph dated 03/12/2022. Chest CT dated 07/09/2019 and 04/16/2009. FINDINGS: Evaluation of this exam is limited in the absence of intravenous contrast. Cardiovascular: There is no cardiomegaly or pericardial effusion. There is 3 vessel coronary vascular calcification. Mild atherosclerotic calcification of the thoracic aorta. No aneurysmal dilatation. The central pulmonary arteries are grossly unremarkable. Mediastinum/Nodes: No hilar or mediastinal adenopathy. Small amount of fluid old in the mid esophagus may represent reflux or delayed clearance. No mediastinal fluid collection. Lungs/Pleura: There is mild centrilobular emphysema. Several bilateral pulmonary  nodules measure up to 7 mm in the left lower lobe along the fissure (79/7). These nodules are relatively stable or minimally increased since the study of 2010 consistent with benign or indolent process. No focal consolidation, pleural effusion, or pneumothorax. The central airways are patent. Upper Abdomen: Gallstone. Musculoskeletal: Osteopenia with degenerative changes of the spine. No acute osseous pathology. IMPRESSION: 1. No acute intrathoracic pathology. 2. Multiple pulmonary nodules relatively stable since the study of 2010 consistent with benign or indolent process. 3. Cholelithiasis. 4. Three-vessel coronary vascular calcification. 5. Aortic Atherosclerosis (ICD10-I70.0) and Emphysema (ICD10-J43.9). Electronically Signed   By: 2011 M.D.   On: 03/12/2022 20:45   DG Chest 2 View  Result Date: 03/12/2022 CLINICAL DATA:  Cough. EXAM: CHEST - 2 VIEW COMPARISON:  07/09/2019 FINDINGS: The lungs are clear without focal pneumonia, edema, pneumothorax or pleural effusion. The cardiopericardial silhouette is within normal limits for size. Symmetric nodular densities over each lower lobe are compatible with nipple shadows. There is an additional tiny nodule projecting over the anterior left fourth rib. No worrisome lytic or sclerotic osseous abnormality. IMPRESSION: 1. No acute cardiopulmonary findings. 2. Tiny nodule projecting over the anterior left fourth rib. CT chest without contrast recommended to further evaluate. Electronically Signed   By: 07/11/2019 M.D.   On: 03/12/2022 19:20    Lab Results:  CBC    Component  Value Date/Time   WBC 6.4 03/12/2022 1249   RBC 4.29 03/12/2022 1249   HGB 12.7 (L) 03/12/2022 1249   HCT 39.1 03/12/2022 1249   PLT 337 03/12/2022 1249   MCV 91.1 03/12/2022 1249   MCH 29.6 03/12/2022 1249   MCHC 32.5 03/12/2022 1249   RDW 14.1 03/12/2022 1249   LYMPHSABS 1.9 11/30/2018 2119   MONOABS 0.6 11/30/2018 2119   EOSABS 0.1 11/30/2018 2119   BASOSABS 0.0  11/30/2018 2119    BMET    Component Value Date/Time   NA 138 03/12/2022 1249   K 4.4 03/12/2022 1249   CL 106 03/12/2022 1249   CO2 26 03/12/2022 1249   GLUCOSE 101 (H) 03/12/2022 1249   BUN 13 03/12/2022 1249   CREATININE 1.04 03/12/2022 1249   CALCIUM 9.3 03/12/2022 1249   GFRNONAA >60 03/12/2022 1249   GFRAA >60 07/09/2019 0157    BNP No results found for: "BNP"  ProBNP No results found for: "PROBNP"  Specialty Problems   None   No Known Allergies  Immunization History  Administered Date(s) Administered   Tdap 09/02/2018    Past Medical History:  Diagnosis Date   Arthritis    Humerus fracture    left   Wears partial dentures     Tobacco History: Social History   Tobacco Use  Smoking Status Every Day   Packs/day: 0.50   Years: 35.00   Total pack years: 17.50   Types: Cigarettes  Smokeless Tobacco Never  Tobacco Comments   Pt states he smoke 1/2 or less a day. ALS 03/29/2022   Ready to quit: Not Answered Counseling given: Not Answered Tobacco comments: Pt states he smoke 1/2 or less a day. ALS 03/29/2022   Continue to not smoke  Outpatient Encounter Medications as of 03/29/2022  Medication Sig   ibuprofen (ADVIL) 600 MG tablet Take 1 tablet (600 mg total) by mouth every 6 (six) hours as needed.   naproxen (NAPROSYN) 500 MG tablet Take 1 tablet (500 mg total) by mouth 2 (two) times daily with a meal.   predniSONE (DELTASONE) 20 MG tablet Take 1 tablet (20 mg total) by mouth daily with breakfast for 5 days.   Tiotropium Bromide-Olodaterol (STIOLTO RESPIMAT) 2.5-2.5 MCG/ACT AERS Inhale 2 puffs into the lungs daily.   [DISCONTINUED] amoxicillin-clavulanate (AUGMENTIN) 875-125 MG tablet Take 1 tablet by mouth every 12 (twelve) hours.   [DISCONTINUED] clindamycin (CLEOCIN) 300 MG capsule Take 1 capsule (300 mg total) by mouth 3 (three) times daily.   [DISCONTINUED] promethazine-dextromethorphan (PROMETHAZINE-DM) 6.25-15 MG/5ML syrup Take 5 mLs by  mouth 4 (four) times daily as needed for cough.   [DISCONTINUED] silver sulfADIAZINE (SILVADENE) 1 % cream Apply 1 application topically daily.   [DISCONTINUED] traMADol (ULTRAM) 50 MG tablet Take 1 tablet (50 mg total) by mouth every 6 (six) hours as needed.   No facility-administered encounter medications on file as of 03/29/2022.     Review of Systems  Review of Systems   Physical Exam  BP 132/76 (BP Location: Left Arm, Patient Position: Sitting, Cuff Size: Normal)   Pulse 64   Ht 5\' 11"  (1.803 m)   Wt 168 lb 6.4 oz (76.4 kg)   SpO2 94%   BMI 23.49 kg/m   Wt Readings from Last 5 Encounters:  03/29/22 168 lb 6.4 oz (76.4 kg)  03/12/22 172 lb (78 kg)  07/09/19 170 lb (77.1 kg)  11/30/18 150 lb (68 kg)  11/10/18 154 lb 15.7 oz (70.3 kg)  BMI Readings from Last 5 Encounters:  03/29/22 23.49 kg/m  03/12/22 23.99 kg/m  07/09/19 24.39 kg/m  11/30/18 21.52 kg/m  11/10/18 22.24 kg/m     Physical Exam General: Sitting in chair, no acute distress Eyes: EOMI, no icterus Neck: Supple, no JVP Pulmonary: Clear, normal work of breathing Cardiovascular: Warm, no edema Abdomen: Nontender, bowel sounds present MSK: No synovitis, no joint effusion Neuro: Normal gait, no weakness Psych: Normal mood, full affect   Assessment & Plan:   Lung nodules: Unchanged on my review and interpretation 2021 to 2023.  Radiology is reviewed and reported back to 2010 with unchanged nodules.  No further follow-up needed.  Dyspnea exertion: Emphysema present.  High suspicion for smoking-related lung disease.  PFTs for further evaluation.  Start Stiolto 2 puffs once daily.   Return in about 3 months (around 06/29/2022).   Karren Burly, MD 03/29/2022   This appointment required 61 minutes of patient care (this includes precharting, chart review, review of results, face-to-face care, etc.).

## 2022-03-29 NOTE — Patient Instructions (Signed)
Nice to meet you  To further evaluate your symptoms, we will obtain pulmonary function test to help [place] understand why you are short of breath  Take prednisone 20 mg a day for 5 days to see if this helps with the shortness of breath and cough  I recommend an inhaler called Stiolto.  Use 2 puffs once a day every day.  This has 2 medicines to help open up the airways and help your breathing and hopefully help the cough as well.  If the inhaler is too expensive, please let me know and I will look for an alternative.  The pulmonary nodules are stable dating back to 2010.  No further follow-up images for these nodules are needed.  Return to clinic in 3 months or sooner as needed with Dr. Judeth Horn

## 2022-04-06 ENCOUNTER — Ambulatory Visit: Payer: 59 | Attending: Cardiology | Admitting: Interventional Cardiology

## 2022-04-06 ENCOUNTER — Encounter: Payer: Self-pay | Admitting: *Deleted

## 2022-04-06 VITALS — BP 144/86 | HR 64 | Ht 70.0 in | Wt 165.6 lb

## 2022-04-06 DIAGNOSIS — R072 Precordial pain: Secondary | ICD-10-CM | POA: Diagnosis not present

## 2022-04-06 DIAGNOSIS — Z79899 Other long term (current) drug therapy: Secondary | ICD-10-CM | POA: Diagnosis not present

## 2022-04-06 DIAGNOSIS — I2510 Atherosclerotic heart disease of native coronary artery without angina pectoris: Secondary | ICD-10-CM

## 2022-04-06 DIAGNOSIS — I2584 Coronary atherosclerosis due to calcified coronary lesion: Secondary | ICD-10-CM | POA: Diagnosis not present

## 2022-04-06 MED ORDER — ROSUVASTATIN CALCIUM 10 MG PO TABS: 10.0000 mg | ORAL_TABLET | Freq: Every day | ORAL | 3 refills | Status: DC

## 2022-04-06 MED ORDER — ASPIRIN 81 MG PO TBEC: DELAYED_RELEASE_TABLET | ORAL | 3 refills | Status: DC

## 2022-04-06 MED ORDER — NICOTINE 14 MG/24HR TD PT24: 14.0000 mg | MEDICATED_PATCH | Freq: Every day | TRANSDERMAL | 3 refills | Status: DC

## 2022-04-06 NOTE — Patient Instructions (Signed)
Medication Instructions:  Your physician has recommended you make the following change in your medication:  Start Rosuvastatin 10 mg by mouth daily. Start nicotine patch 14 mg daily to skin Start aspirin 81 mg by mouth 3 days per week.  *If you need a refill on your cardiac medications before your next appointment, please call your pharmacy*   Lab Work: Your physician recommends that you return for lab work in: 3 months.  Lipid and liver profiles.  This will be fasting.    If you have labs (blood work) drawn today and your tests are completely normal, you will receive your results only by: MyChart Message (if you have MyChart) OR A paper copy in the mail If you have any lab test that is abnormal or we need to change your treatment, we will call you to review the results.   Testing/Procedures: Your physician has requested that you have an exercise stress myoview. For further information please visit [email or web address] Please follow instruction sheet, as given.    Follow-Up: At [facility], you and your health needs are our priority.  As part of our continuing mission to provide you with exceptional heart care, we have created designated Provider Care Teams.  These Care Teams include your primary Cardiologist (physician) and Advanced Practice Providers (APPs -  Physician Assistants and Nurse Practitioners) who all work together to provide you with the care you need, when you need it.  We recommend signing up for the patient portal called "MyChart".  Sign up information is provided on this After Visit Summary.  MyChart is used to connect with patients for Virtual Visits (Telemedicine).  Patients are able to view lab/test results, encounter notes, upcoming appointments, etc.  Non-urgent messages can be sent to your provider as well.   To learn more about what you can do with MyChart, go to [email or web address].    Your next appointment:   6 month(s)  The format for your next  appointment:   In Person  Provider:   Lance Muss, MD     Other Instructions   Check blood pressure at home and keep record of readings  Important Information About Sugar

## 2022-04-06 NOTE — Progress Notes (Signed)
Cardiology Office Note   Date:  04/06/2022   ID:  Maurice Swanson, DOB [birth year], MRN [id number]  PCP:  Patient, [name]    No chief complaint on file.  Coronary artery calcification  Wt Readings from Last 3 Encounters:  04/06/22 165 lb 9.6 oz (75.1 kg)  03/29/22 168 lb 6.4 oz (76.4 kg)  03/12/22 172 lb (78 kg)       History of Present Illness: Maurice Swanson is a 61 y.o. male who is being seen today for the evaluation of coronary artery calcification at the request of Blue, Soijett A, PA-C.   Chest CT in October 2023 showed: "Cardiovascular: There is no cardiomegaly or pericardial effusion. There is 3 vessel coronary vascular calcification. Mild atherosclerotic calcification of the thoracic aorta. No aneurysmal dilatation. The central pulmonary arteries are grossly unremarkable."  Lungs showed: "There is mild centrilobular emphysema. Several bilateral pulmonary nodules measure up to 7 mm in the left lower lobe along the fissure (79/7). These nodules are relatively stable or minimally increased since the study of 2010 consistent with benign or indolent process. No focal consolidation, pleural effusion, or pneumothorax. The central airways are patent."  Denies : . Dizziness. Leg edema. Nitroglycerin use. Orthopnea. Palpitations. Paroxysmal nocturnal dyspnea. Shortness of breath. Syncope.    Reports chest and arm pain, not always with exertion.  Works as a Theme park manager.  Has to climb up ladders.  No h/o HTN.  Has been borderline over the past few years.  No meds prescribed.    PFTs scheduled.   Still smoking.  Willing to try a patch.  Between 0.5-1 ppd.     Past Medical History:  Diagnosis Date   Arthritis    Humerus fracture    left   Wears partial dentures     Past Surgical History:  Procedure Laterality Date   HIP SURGERY     INCISION AND DRAINAGE ABSCESS N/A 11/10/2018   Procedure: INCISION AND DRAINAGE NECK ABSCESS;  Surgeon: Melida Quitter, MD;   Location: [facility] OR;  Service: ENT;  Laterality: N/A;   MULTIPLE TOOTH EXTRACTIONS     SHOULDER CLOSED REDUCTION Left 10/02/2018   Procedure: manipulation of shoulder and elbow;  Surgeon: Nicholes Stairs, MD;  Location: [place];  Service: Orthopedics;  Laterality: Left;     Current Outpatient Medications  Medication Sig Dispense Refill   ibuprofen (ADVIL) 600 MG tablet Take 1 tablet (600 mg total) by mouth every 6 (six) hours as needed. (Patient not taking: Reported on 04/06/2022) 30 tablet 0   naproxen (NAPROSYN) 500 MG tablet Take 1 tablet (500 mg total) by mouth 2 (two) times daily with a meal. (Patient not taking: Reported on 04/06/2022) 30 tablet 0   Tiotropium Bromide-Olodaterol (STIOLTO RESPIMAT) 2.5-2.5 MCG/ACT AERS Inhale 2 puffs into the lungs daily. (Patient not taking: Reported on 04/06/2022) 1 each 3   No current facility-administered medications for this visit.    Allergies:   Patient has no known allergies.    Social History:  The patient  reports that he has been smoking cigarettes. He has a 17.50 pack-year smoking history. He has never used smokeless tobacco. He reports that he does not drink alcohol and does not use drugs.   Family History:  The patient's family history includes Hypertension in his mother.    ROS:  Please see the history of present illness.   Otherwise, review of systems are positive for DOE.   All other systems are reviewed and negative.  PHYSICAL EXAM: VS:  BP (!) 152/98   Pulse 64   Ht 5\' 10"  (1.778 m)   Wt 165 lb 9.6 oz (75.1 kg)   SpO2 98%   BMI 23.76 kg/m  , BMI Body mass index is 23.76 kg/m. GEN: Well nourished, well developed, in no acute distress HEENT: normal Neck: no JVD, carotid bruits, or masses Cardiac: RRR; no murmurs, rubs, or gallops,no edema  Respiratory:  clear to auscultation bilaterally, subtle increased work of breathing, likely related to his emphysema GI: soft, nontender, nondistended, + BS MS: no deformity or  atrophy Skin: warm and dry, no rash Neuro:  Strength and sensation are intact Psych: euthymic mood, full affect   EKG:   The ekg ordered today demonstrates normal ECG   Recent Labs: 03/12/2022: ALT 14; BUN 13; Creatinine, Ser 1.04; Hemoglobin 12.7; Platelets 337; Potassium 4.4; Sodium 138   Lipid Panel No results found for: "CHOL", "TRIG", "HDL", "CHOLHDL", "VLDL", "LDLCALC", "LDLDIRECT"   Other studies Reviewed: Additional studies/ records that were reviewed today with results demonstrating: Labs reviewed -normal renal function and liver function in October 2023.   ASSESSMENT AND PLAN:  Coronary artery calcification: We spoke about lifestyle changes.  Exercise target noted below.  Whole food, plant-based diet recommended.  High-fiber diet.  Avoid processed foods.  Rosuvastatin 10 mg daily.  Check liver and lipids in 3 months.  We talked about starting baby aspirin daily.  He feels that he bleeds easily as it is.  He will start it 3 days a week.  He does have some chest discomfort.  Feels like a tightness.  Will check exercise Myoview. Smoker: Nicotine patch 14 mg.  He does want to try to stop smoking. Elevated BP reading: Decrease salt intake.  Avoid processed foods.  Check blood pressures at home.  He remains active at work.   Current medicines are reviewed at length with the patient today.  The patient concerns regarding his medicines were addressed.  The following changes have been made:  No change  Labs/ tests ordered today include: stress test No orders of the defined types were placed in this encounter.   Recommend 150 minutes/week of aerobic exercise Low fat, low carb, high fiber diet recommended  Disposition:   FU in for stress test   Signed, November 2023, MD  04/06/2022 4:24 PM    [facility] [address] [place], [place], [place]  [place] Phone: [phone]; Fax: [phone]

## 2022-04-11 ENCOUNTER — Telehealth (HOSPITAL_COMMUNITY): Payer: Self-pay | Admitting: *Deleted

## 2022-04-11 NOTE — Addendum Note (Signed)
Addended by: Humbert Morozov R on: 04/11/2022 12:47 PM   Modules accepted: Orders  

## 2022-04-11 NOTE — Telephone Encounter (Signed)
Attempted to call patient regarding upcoming appointment- no answer, unable to leave a message.  Maurice Swanson  

## 2022-04-12 ENCOUNTER — Encounter (HOSPITAL_COMMUNITY): Payer: 59

## 2022-04-13 ENCOUNTER — Telehealth (HOSPITAL_COMMUNITY): Payer: Self-pay | Admitting: *Deleted

## 2022-04-13 NOTE — Telephone Encounter (Signed)
Attempted to call patient regarding upcoming appointment- no answer, unable to leave a message.  Maurice Swanson  

## 2022-04-19 ENCOUNTER — Ambulatory Visit (HOSPITAL_COMMUNITY): Payer: 59 | Attending: Interventional Cardiology

## 2022-04-19 DIAGNOSIS — I2584 Coronary atherosclerosis due to calcified coronary lesion: Secondary | ICD-10-CM | POA: Diagnosis not present

## 2022-04-19 DIAGNOSIS — I2510 Atherosclerotic heart disease of native coronary artery without angina pectoris: Secondary | ICD-10-CM | POA: Insufficient documentation

## 2022-04-19 DIAGNOSIS — R072 Precordial pain: Secondary | ICD-10-CM | POA: Insufficient documentation

## 2022-04-19 LAB — MYOCARDIAL PERFUSION IMAGING
Angina Index: 0
Duke Treadmill Score: 6
Estimated workload: 7
Exercise duration (min): 5 min
Exercise duration (sec): 56 s
LV dias vol: 84 mL (ref 62–150)
LV sys vol: 39 mL
MPHR: 160 {beats}/min
Nuc Stress EF: 54 %
Peak HR: 137 {beats}/min
Percent HR: 85 %
Rest HR: 57 {beats}/min
Rest Nuclear Isotope Dose: 10.6 mCi
SDS: 0
SRS: 0
SSS: 0
ST Depression (mm): 0 mm
Stress Nuclear Isotope Dose: 30.4 mCi
TID: 0.95

## 2022-04-19 MED ORDER — TECHNETIUM TC 99M TETROFOSMIN IV KIT
30.4000 | PACK | Freq: Once | INTRAVENOUS | Status: AC | PRN
  Administered 2022-04-19: 30.4 via INTRAVENOUS

## 2022-04-19 MED ORDER — TECHNETIUM TC 99M TETROFOSMIN IV KIT
10.6000 | PACK | Freq: Once | INTRAVENOUS | Status: AC | PRN
  Administered 2022-04-19: 10.6 via INTRAVENOUS

## 2022-07-07 ENCOUNTER — Other Ambulatory Visit: Payer: 59

## 2022-09-22 ENCOUNTER — Ambulatory Visit: Payer: 59 | Admitting: Pulmonary Disease

## 2022-10-06 ENCOUNTER — Encounter: Payer: Self-pay | Admitting: Pulmonary Disease

## 2022-10-10 NOTE — Progress Notes (Deleted)
Cardiology Office Note   Date:  10/10/2022   ID:  Maurice Swanson, DOB [birth year], MRN [id number]  PCP:  Patient, [name]    No chief complaint on file.  Coronary artery calcification  Wt Readings from Last 3 Encounters:  04/19/22 165 lb (74.8 kg)  04/06/22 165 lb 9.6 oz (75.1 kg)  03/29/22 168 lb 6.4 oz (76.4 kg)       History of Present Illness: Maurice Swanson is a 62 y.o. male who had coronary artery calcification diagnosed by chest CT.  Chest CT in October 2023 showed: "Cardiovascular: There is no cardiomegaly or pericardial effusion. There is 3 vessel coronary vascular calcification. Mild atherosclerotic calcification of the thoracic aorta. No aneurysmal dilatation. The central pulmonary arteries are grossly unremarkable."   Lungs showed: "There is mild centrilobular emphysema. Several bilateral pulmonary nodules measure up to 7 mm in the left lower lobe along the fissure (79/7). These nodules are relatively stable or minimally increased since the study of 2010 consistent with benign or indolent process. No focal consolidation, pleural effusion, or pneumothorax. The central airways are patent."   As of 11/23: "Reports chest and arm pain, not always with exertion.  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 No h/o HTN.  Has been borderline over the past few years.  No meds prescribed.   PFTs scheduled.   Still smoking.  Willing to try a patch.  Between 0.5-1 ppd. " 14 mg nicotine patch was prescribed in November 2023.  Lifestyle changes to help with blood pressure were also given in November 2023.  Past Medical History:  Diagnosis Date   Arthritis    Humerus fracture    left   Wears partial dentures     Past Surgical History:  Procedure Laterality Date   HIP SURGERY     INCISION AND DRAINAGE ABSCESS N/A 11/10/2018   Procedure: INCISION AND DRAINAGE NECK ABSCESS;  Surgeon: Christia Reading, MD;  Location: [facility] OR;  Service: ENT;  Laterality: N/A;    MULTIPLE TOOTH EXTRACTIONS     SHOULDER CLOSED REDUCTION Left 10/02/2018   Procedure: manipulation of shoulder and elbow;  Surgeon: Yolonda Kida, MD;  Location: [facility] OR;  Service: Orthopedics;  Laterality: Left;     Current Outpatient Medications  Medication Sig Dispense Refill   aspirin EC 81 MG tablet Take one tablet by mouth three days per week. Swallow whole. 90 tablet 3   ibuprofen (ADVIL) 600 MG tablet Take 1 tablet (600 mg total) by mouth every 6 (six) hours as needed. (Patient not taking: Reported on 04/06/2022) 30 tablet 0   naproxen (NAPROSYN) 500 MG tablet Take 1 tablet (500 mg total) by mouth 2 (two) times daily with a meal. (Patient not taking: Reported on 04/06/2022) 30 tablet 0   nicotine (NICODERM CQ - DOSED IN MG/24 HOURS) 14 mg/24hr patch Place 1 patch (14 mg total) onto the skin daily. 28 patch 3   rosuvastatin (CRESTOR) 10 MG tablet Take 1 tablet (10 mg total) by mouth daily. 90 tablet 3   Tiotropium Bromide-Olodaterol (STIOLTO RESPIMAT) 2.5-2.5 MCG/ACT AERS Inhale 2 puffs into the lungs daily. (Patient not taking: Reported on 04/06/2022) 1 each 3   No current facility-administered medications for this visit.    Allergies:   Patient has no known allergies.    Social History:  The patient  reports that he has been smoking cigarettes. He has a 17.50 pack-year smoking history. He has never used smokeless tobacco.  He reports that he does not drink alcohol and does not use drugs.   Family History:  The patient's ***family history includes Hypertension in his mother.    ROS:  Please see the history of present illness.   Otherwise, review of systems are positive for ***.   All other systems are reviewed and negative.    PHYSICAL EXAM: VS:  There were no vitals taken for this visit. , BMI There is no height or weight on file to calculate BMI. GEN: Well nourished, well developed, in no acute distress HEENT: normal Neck: no JVD, carotid bruits, or masses Cardiac:  ***RRR; no murmurs, rubs, or gallops,no edema  Respiratory:  clear to auscultation bilaterally, normal work of breathing GI: soft, nontender, nondistended, + BS MS: no deformity or atrophy Skin: warm and dry, no rash Neuro:  Strength and sensation are intact Psych: euthymic mood, full affect   EKG:   The ekg ordered today demonstrates ***   Recent Labs: 03/12/2022: ALT 14; BUN 13; Creatinine, Ser 1.04; Hemoglobin 12.7; Platelets 337; Potassium 4.4; Sodium 138   Lipid Panel No results found for: "CHOL", "TRIG", "HDL", "CHOLHDL", "VLDL", "LDLCALC", "LDLDIRECT"   Other studies Reviewed: Additional studies/ records that were reviewed today with results demonstrating: ***.   ASSESSMENT AND PLAN:  Coronary artery calcification: Smoker: Elevated blood pressure:   Current medicines are reviewed at length with the patient today.  The patient concerns regarding his medicines were addressed.  The following changes have been made:  No change***  Labs/ tests ordered today include: *** No orders of the defined types were placed in this encounter.   Recommend 150 minutes/week of aerobic exercise Low fat, low carb, high fiber diet recommended  Disposition:   FU in ***   Signed, Lance Muss, MD  10/10/2022 9:38 PM    [facility] [address] [place], [place], [place]  [address] Phone: [phone]; Fax: [phone]

## 2022-10-12 ENCOUNTER — Ambulatory Visit: Payer: 59 | Attending: Interventional Cardiology | Admitting: Interventional Cardiology

## 2022-10-12 DIAGNOSIS — I2584 Coronary atherosclerosis due to calcified coronary lesion: Secondary | ICD-10-CM

## 2023-09-15 DIAGNOSIS — F1990 Other psychoactive substance use, unspecified, uncomplicated: Secondary | ICD-10-CM | POA: Insufficient documentation

## 2023-09-15 DIAGNOSIS — D72829 Elevated white blood cell count, unspecified: Secondary | ICD-10-CM | POA: Insufficient documentation

## 2023-09-15 DIAGNOSIS — J9601 Acute respiratory failure with hypoxia: Secondary | ICD-10-CM | POA: Insufficient documentation

## 2023-09-15 DIAGNOSIS — I10 Essential (primary) hypertension: Secondary | ICD-10-CM | POA: Insufficient documentation

## 2023-09-15 DIAGNOSIS — R0902 Hypoxemia: Secondary | ICD-10-CM | POA: Insufficient documentation

## 2023-10-09 ENCOUNTER — Emergency Department (HOSPITAL_COMMUNITY)
Admission: EM | Admit: 2023-10-09 | Discharge: 2023-10-10 | Discharge status: Against Medical Advice | Attending: Emergency Medicine | Admitting: Emergency Medicine

## 2023-10-09 DIAGNOSIS — L0211 Cutaneous abscess of neck: Secondary | ICD-10-CM | POA: Insufficient documentation

## 2023-10-09 DIAGNOSIS — Z5321 Procedure and treatment not carried out due to patient leaving prior to being seen by health care provider: Secondary | ICD-10-CM | POA: Diagnosis not present

## 2023-10-09 NOTE — ED Notes (Signed)
 Patient decided he did not want to wait so he left the building.

## 2023-10-09 NOTE — ED Triage Notes (Signed)
 Abscess on back of neck. Pt just noticed today and it has been draining.

## 2023-10-13 ENCOUNTER — Emergency Department (HOSPITAL_COMMUNITY)
Admission: EM | Admit: 2023-10-13 | Discharge: 2023-10-13 | Disposition: A | Discharge status: Home / Self Care | Attending: Emergency Medicine | Admitting: Emergency Medicine

## 2023-10-13 ENCOUNTER — Other Ambulatory Visit: Payer: Self-pay

## 2023-10-13 DIAGNOSIS — L03113 Cellulitis of right upper limb: Secondary | ICD-10-CM | POA: Insufficient documentation

## 2023-10-13 DIAGNOSIS — L03114 Cellulitis of left upper limb: Secondary | ICD-10-CM | POA: Insufficient documentation

## 2023-10-13 DIAGNOSIS — I10 Essential (primary) hypertension: Secondary | ICD-10-CM | POA: Diagnosis not present

## 2023-10-13 DIAGNOSIS — L0390 Cellulitis, unspecified: Secondary | ICD-10-CM

## 2023-10-13 DIAGNOSIS — L989 Disorder of the skin and subcutaneous tissue, unspecified: Secondary | ICD-10-CM | POA: Insufficient documentation

## 2023-10-13 DIAGNOSIS — Z7982 Long term (current) use of aspirin: Secondary | ICD-10-CM | POA: Diagnosis not present

## 2023-10-13 DIAGNOSIS — I2510 Atherosclerotic heart disease of native coronary artery without angina pectoris: Secondary | ICD-10-CM | POA: Diagnosis not present

## 2023-10-13 DIAGNOSIS — R21 Rash and other nonspecific skin eruption: Secondary | ICD-10-CM | POA: Diagnosis present

## 2023-10-13 DIAGNOSIS — J449 Chronic obstructive pulmonary disease, unspecified: Secondary | ICD-10-CM | POA: Insufficient documentation

## 2023-10-13 LAB — BASIC METABOLIC PANEL WITH GFR
Anion gap: 9 (ref 5–15)
BUN: 14 mg/dL (ref 8–23)
CO2: 22 mmol/L (ref 22–32)
Calcium: 8.7 mg/dL — ABNORMAL LOW (ref 8.9–10.3)
Chloride: 102 mmol/L (ref 98–111)
Creatinine, Ser: 0.83 mg/dL (ref 0.61–1.24)
GFR, Estimated: >60 mL/min (ref >60)
Glucose, Bld: 113 mg/dL — ABNORMAL HIGH (ref 70–99)
Potassium: 3.8 mmol/L (ref 3.5–5.1)
Sodium: 133 mmol/L — ABNORMAL LOW (ref 135–145)

## 2023-10-13 LAB — CBC WITH DIFFERENTIAL/PLATELET
Abs Immature Granulocytes: 0.03 10*3/uL (ref 0.00–0.07)
Basophils Absolute: 0 10*3/uL (ref 0.0–0.1)
Basophils Relative: 0 %
Eosinophils Absolute: 0.1 10*3/uL (ref 0.0–0.5)
Eosinophils Relative: 2 %
HCT: 37.5 % — ABNORMAL LOW (ref 39.0–52.0)
Hemoglobin: 12 g/dL — ABNORMAL LOW (ref 13.0–17.0)
Immature Granulocytes: 0 %
Lymphocytes Relative: 16 %
Lymphs Abs: 1.3 10*3/uL (ref 0.7–4.0)
MCH: 29.1 pg (ref 26.0–34.0)
MCHC: 32 g/dL (ref 30.0–36.0)
MCV: 90.8 fL (ref 80.0–100.0)
Monocytes Absolute: 0.7 10*3/uL (ref 0.1–1.0)
Monocytes Relative: 8 %
Neutro Abs: 6.1 10*3/uL (ref 1.7–7.7)
Neutrophils Relative %: 74 %
Platelets: 344 10*3/uL (ref 150–400)
RBC: 4.13 MIL/uL — ABNORMAL LOW (ref 4.22–5.81)
RDW: 15.1 % (ref 11.5–15.5)
WBC: 8.3 10*3/uL (ref 4.0–10.5)
nRBC: 0 % (ref 0.0–0.2)

## 2023-10-13 MED ORDER — LIDOCAINE-EPINEPHRINE-TETRACAINE (LET) TOPICAL GEL
3.0000 mL | Freq: Once | TOPICAL | Status: AC
  Administered 2023-10-13: 3 mL via TOPICAL
  Filled 2023-10-13: qty 3

## 2023-10-13 MED ORDER — DOXYCYCLINE HYCLATE 100 MG PO CAPS: 100.0000 mg | ORAL_CAPSULE | Freq: Two times a day (BID) | ORAL | 0 refills | Status: DC

## 2023-10-13 NOTE — ED Notes (Signed)
 Pt has various wounds all over body that resemble bug bites that may have been infected. Wounds are approximated and healing.

## 2023-10-13 NOTE — Discharge Instructions (Addendum)
Contact a health care provider if: You have a fever. Your symptoms do not improve within 1-2 days of starting treatment or you develop new symptoms. Your bone or joint underneath the infected area becomes painful after the skin has healed. Your infection returns in the same area or another area. Signs of this may include: You notice a swollen bump in the infected area. Your red area gets larger, turns dark in color, or becomes more painful. Drainage increases. Pus or a bad smell develops in your infected area. You have more pain. You feel ill and have muscle aches and weakness. You develop vomiting or diarrhea that will not go away. Get help right away if: You notice red streaks coming from the infected area. You notice the skin turns purple or black and falls off. This symptom may be an emergency. Get help right away. Call 911. Do not wait to see if the symptom will go away. Do not drive yourself to the hospital.

## 2023-10-13 NOTE — ED Provider Notes (Signed)
 Maurice Swanson EMERGENCY DEPARTMENT AT [facility] Provider Note   CSN: [id number] Arrival date & time: 10/13/23  4098     History  Chief Complaint  Patient presents with   Wound Check    Pt presents today with sores all over his body (arms, neck, and back) some are draining purulent fluid and some are warm to the touch, all are painful to the touch. Pt reports some fever (low grade) over the last three days. Went to [facility] three days ago but LWBS. No other symptoms    Maurice Swanson is a 63 y.o. male who presents for evaluation of rash.  He has a past medical history of CAD, COPD, hypertension.  He was recently hospitalized at the beginning of May at [facility] health after an unintentional opiate overdose, bilateral pneumonia and hypoxia.  He was discharged on Augmentin , prednisone , Trelegy inhaler, and Tessalon.  The patient reports that he did not complete his course of antibiotics.  5 days ago he began noticing painful lesions pop up on his skin.  He states that his first 1 was present on the back of his neck but since that time he has had multiple lesions pop up which are painful and draining.  He states the ones on the back of his neck seems to have "gone down."  He is never had anything like this before.  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  He denies a history of diabetes or skin lesions that are similar.  He denies IV drug use, fevers or chills.  He denies a history of MRSA., no mucosal involvement.   Wound Check       Home Medications Prior to Admission medications   Medication Sig Start Date End Date Taking? Authorizing Provider  aspirin  EC 81 MG tablet Take one tablet by mouth three days per week. Swallow whole. 04/06/22   Lucendia Rusk, MD  ibuprofen  (ADVIL ) 600 MG tablet Take 1 tablet (600 mg total) by mouth every 6 (six) hours as needed. Patient not taking:  Reported on 04/06/2022 11/30/18   Sueellen Emery, MD  naproxen  (NAPROSYN ) 500 MG tablet Take 1 tablet (500 mg total) by mouth 2 (two) times daily with a meal. Patient not taking: Reported on 04/06/2022 12/20/20   Adolph Hoop, PA-C  nicotine  (NICODERM CQ  - DOSED IN MG/24 HOURS) 14 mg/24hr patch Place 1 patch (14 mg total) onto the skin daily. 04/06/22   Lucendia Rusk, MD  rosuvastatin  (CRESTOR ) 10 MG tablet Take 1 tablet (10 mg total) by mouth daily. 04/06/22   Lucendia Rusk, MD  Tiotropium Bromide-Olodaterol (STIOLTO RESPIMAT ) 2.5-2.5 MCG/ACT AERS Inhale 2 puffs into the lungs daily. Patient not taking: Reported on 04/06/2022 03/29/22   Hunsucker, Archer Kobs, MD      Allergies    Patient has no known allergies.    Review of Systems   Review of Systems  Physical Exam Updated Vital Signs BP (!) 139/92 (BP Location: Right Arm)   Pulse 91   Temp 98.9 F (37.2 C) (Oral)   Resp 20   SpO2 98%  Physical Exam Vitals and nursing note reviewed.  Constitutional:      General: He is not in acute distress.    Appearance: He is well-developed. He is not diaphoretic.  HENT:     Head: Normocephalic and atraumatic.  Eyes:     General:  No scleral icterus.    Conjunctiva/sclera: Conjunctivae normal.  Cardiovascular:     Rate and Rhythm: Normal rate and regular rhythm.     Heart sounds: Normal heart sounds.  Pulmonary:     Effort: Pulmonary effort is normal. No respiratory distress.     Breath sounds: Normal breath sounds.  Abdominal:     Palpations: Abdomen is soft.     Tenderness: There is no abdominal tenderness.  Musculoskeletal:     Cervical back: Normal range of motion and neck supple.     Comments: Multiple lesions in various states of eruption over the upper body arms and hands.  Some of the lesions appear to be open ulcerations somewhat necrotic centers, somewhat purulent drainage surrounding erythema and induration consistent with cellulitis.  Other lesions appear to be  unerupted with violaceous center.  Painful to the touch.  No desquamation of the skin noted.  Skin:    General: Skin is warm and dry.  Neurological:     Mental Status: He is alert.  Psychiatric:        Behavior: Behavior normal.                        ED Results / Procedures / Treatments   Labs (all labs ordered are listed, but only abnormal results are displayed) Labs Reviewed  AEROBIC CULTURE W GRAM STAIN (SUPERFICIAL SPECIMEN)  BASIC METABOLIC PANEL WITH GFR  CBC WITH DIFFERENTIAL/PLATELET    EKG None  Radiology No results found.  Procedures Procedures    Medications Ordered in ED Medications  lidocaine -EPINEPHrine -tetracaine  (LET) topical gel (has no administration in time range)    ED Course/ Medical Decision Making/ A&P                                 Medical Decision Making 63 year old male who presents emergency department chief complaint of rash. Differential diagnosis includes MRSA, less likely autoimmune process like pyogenic gangrenosum, patient's symptoms do not appear to be consistent with emergent rash such as SJS or TENS.  Patient's labs reviewed.  He does not appear to have an elevated white count and denies fever or chills. Recent admission blood cultures from [facility] negative. Wounds cultures pending.  D/C with doxycycline  and patient advised to avoid direct sunlight due to risk of burn.  Discussed return precautions.  Amount and/or Complexity of Data Reviewed External Data Reviewed: labs and notes. Labs: ordered.    Details: I reviewed and interpreted labs: no acute findings  Risk Prescription drug management.           Final Clinical Impression(s) / ED Diagnoses Final diagnoses:  None    Rx / DC Orders ED Discharge Orders     None         Tama Fails, PA-C 10/15/23 0915    Arvilla Birmingham, MD 10/15/23 1230

## 2023-10-15 ENCOUNTER — Telehealth (HOSPITAL_COMMUNITY): Payer: Self-pay | Admitting: Emergency Medicine

## 2023-10-15 MED ORDER — CEFADROXIL 500 MG PO CAPS: 500.0000 mg | ORAL_CAPSULE | Freq: Two times a day (BID) | ORAL | 0 refills | Status: AC

## 2023-10-15 NOTE — Telephone Encounter (Cosign Needed)
 Wound culture reports reviwed,  + Strep pyogenes (Beta Hemolytic Strep)  Discussed findings and need for additional coverage- change patient to Cephalosporin given recent Augmentin  use. Also recommend bathing with Hibiclens .. Patient reports that he was doing better but pain and swelling worsened today.

## 2023-10-17 LAB — AEROBIC CULTURE W GRAM STAIN (SUPERFICIAL SPECIMEN)

## 2023-10-18 ENCOUNTER — Telehealth (HOSPITAL_BASED_OUTPATIENT_CLINIC_OR_DEPARTMENT_OTHER): Payer: Self-pay | Admitting: *Deleted

## 2023-10-18 NOTE — Telephone Encounter (Signed)
 Post ED Visit - Positive Culture Follow-up  Culture report reviewed by antimicrobial stewardship pharmacist: [facility] []  Court Distance, Pharm.D. []  Skeet Duke, Pharm.D., BCPS AQ-ID []  Leslee Rase, Pharm.D., BCPS []  Garland Junk, [address].D., BCPS []  [place], [address].D., BCPS, AAHIVP []  Alcide Aly, Pharm.D., BCPS, AAHIVP []  Jerri Morale, PharmD, BCPS []  Graham Laws, PharmD, BCPS []  Cleda Curly, PharmD, BCPS []  Tamar Fairly, PharmD []  Ballard Levels, PharmD, BCPS []  Ollen Beverage, PharmD  [facility] [x]  Denson Flake, PharmD []  Sherryle Don, PharmD []  Van Gelinas, PharmD []  Delila Felty, Rph []  [place]) Cleora Daft, PharmD []  Augustina Block, PharmD []  Arie Kurtz, PharmD []  Sharlyn Deaner, PharmD []  Agnes Hose, PharmD []  Kendall Pauls, PharmD []  Gladstone Lamer, PharmD []  Armanda Bern, PharmD []  Tera Fellows, PharmD   Positive wound culture (neck wound) 10/13/2023. Treated with doxycycline , and cefadroxil was added to cover Group A strep on 6/1.  Organism sensitive to the same and no further patient follow-up is required at this time.  Zeb Heys 10/18/2023, 12:19 PM

## 2024-01-17 ENCOUNTER — Ambulatory Visit: Admission: EM | Admit: 2024-01-17 | Discharge: 2024-01-17 | Discharge status: Against Medical Advice | Payer: Self-pay

## 2024-01-17 NOTE — ED Triage Notes (Signed)
Called patient, not in lobby

## 2024-01-17 NOTE — ED Triage Notes (Signed)
 Tried calling again by staff and patient access, No answer.

## 2024-01-17 NOTE — ED Triage Notes (Signed)
 No Answer at phone number provided when trying to call patient to room.

## 2024-05-29 NOTE — Anesthesia Preprocedure Evaluation (Addendum)
"                                    Anesthesia Evaluation  Patient identified by MRN, date of birth, ID band Patient awake    Reviewed: Allergy & Precautions, NPO status , Patient's Chart, lab work & pertinent test results  History of Anesthesia Complications Negative for: history of anesthetic complications  Airway Mallampati: II  TM Distance: >3 FB Neck ROM: Full    Dental no notable dental hx. (+) Partial Upper, Dental Advisory Given   Pulmonary Current Smoker and Patient abstained from smoking.   Pulmonary exam normal breath sounds clear to auscultation       Cardiovascular hypertension, (-) angina (-) Past MI Normal cardiovascular exam Rhythm:Regular Rate:Normal     Neuro/Psych negative neurological ROS  negative psych ROS   GI/Hepatic Neg liver ROS,,,  Endo/Other  negative endocrine ROS    Renal/GU negative Renal ROS     Musculoskeletal  (+) Arthritis ,    Abdominal   Peds  Hematology   Anesthesia Other Findings   Reproductive/Obstetrics                              Anesthesia Physical Anesthesia Plan  ASA: 2  Anesthesia Plan: General and Regional   Post-op Pain Management: Regional block* and Tylenol  PO (pre-op)*   Induction: Intravenous  PONV Risk Score and Plan: 3 and Treatment may vary due to age or medical condition, Midazolam , Dexamethasone  and Ondansetron   Airway Management Planned: LMA  Additional Equipment: None  Intra-op Plan:   Post-operative Plan: Extubation in OR  Informed Consent: I have reviewed the patients History and Physical, chart, labs and discussed the procedure including the risks, benefits and alternatives for the proposed anesthesia with the patient or authorized representative who has indicated his/her understanding and acceptance.     Dental advisory given  Plan Discussed with:   Anesthesia Plan Comments: (GA +  L pop w exparel )         Anesthesia Quick  Evaluation  "

## 2024-05-30 ENCOUNTER — Other Ambulatory Visit: Payer: Self-pay

## 2024-05-30 ENCOUNTER — Encounter (HOSPITAL_BASED_OUTPATIENT_CLINIC_OR_DEPARTMENT_OTHER): Admission: RE | Disposition: A | Payer: Self-pay | Source: Home / Self Care | Attending: Orthopedic Surgery

## 2024-05-30 ENCOUNTER — Encounter (HOSPITAL_BASED_OUTPATIENT_CLINIC_OR_DEPARTMENT_OTHER): Payer: Self-pay | Admitting: Orthopedic Surgery

## 2024-05-30 ENCOUNTER — Ambulatory Visit (HOSPITAL_BASED_OUTPATIENT_CLINIC_OR_DEPARTMENT_OTHER): Payer: Worker's Compensation | Admitting: Anesthesiology

## 2024-05-30 ENCOUNTER — Ambulatory Visit (HOSPITAL_BASED_OUTPATIENT_CLINIC_OR_DEPARTMENT_OTHER)
Admission: RE | Admit: 2024-05-30 | Discharge: 2024-05-30 | Disposition: A | Discharge status: Home / Self Care | Payer: Worker's Compensation | Attending: Orthopedic Surgery | Admitting: Orthopedic Surgery

## 2024-05-30 DIAGNOSIS — I10 Essential (primary) hypertension: Secondary | ICD-10-CM | POA: Insufficient documentation

## 2024-05-30 DIAGNOSIS — M65862 Other synovitis and tenosynovitis, left lower leg: Secondary | ICD-10-CM | POA: Insufficient documentation

## 2024-05-30 DIAGNOSIS — M1990 Unspecified osteoarthritis, unspecified site: Secondary | ICD-10-CM | POA: Insufficient documentation

## 2024-05-30 DIAGNOSIS — X58XXXA Exposure to other specified factors, initial encounter: Secondary | ICD-10-CM | POA: Diagnosis not present

## 2024-05-30 DIAGNOSIS — Z01818 Encounter for other preprocedural examination: Secondary | ICD-10-CM

## 2024-05-30 DIAGNOSIS — Z8249 Family history of ischemic heart disease and other diseases of the circulatory system: Secondary | ICD-10-CM | POA: Insufficient documentation

## 2024-05-30 DIAGNOSIS — M7672 Peroneal tendinitis, left leg: Secondary | ICD-10-CM | POA: Diagnosis present

## 2024-05-30 DIAGNOSIS — S86312A Strain of muscle(s) and tendon(s) of peroneal muscle group at lower leg level, left leg, initial encounter: Secondary | ICD-10-CM | POA: Insufficient documentation

## 2024-05-30 DIAGNOSIS — F17210 Nicotine dependence, cigarettes, uncomplicated: Secondary | ICD-10-CM | POA: Insufficient documentation

## 2024-05-30 HISTORY — PX: REPAIR OF PERONEUS BREVIS TENDON: SHX6215

## 2024-05-30 MED ORDER — PROPOFOL 10 MG/ML IV BOLUS
INTRAVENOUS | Status: AC
  Filled 2024-05-30: qty 20

## 2024-05-30 MED ORDER — BUPIVACAINE LIPOSOME 1.3 % IJ SUSP
INTRAMUSCULAR | Status: DC | PRN
  Administered 2024-05-30: 10 mL via PERINEURAL

## 2024-05-30 MED ORDER — LIDOCAINE 2% (20 MG/ML) 5 ML SYRINGE
INTRAMUSCULAR | Status: AC
  Filled 2024-05-30: qty 5

## 2024-05-30 MED ORDER — DEXAMETHASONE SOD PHOSPHATE PF 10 MG/ML IJ SOLN
INTRAMUSCULAR | Status: AC
  Filled 2024-05-30: qty 1

## 2024-05-30 MED ORDER — ACETAMINOPHEN 10 MG/ML IV SOLN: 1000.0000 mg | Freq: Once | INTRAVENOUS | Status: DC | PRN

## 2024-05-30 MED ORDER — FENTANYL CITRATE (PF) 100 MCG/2ML IJ SOLN: 25.0000 ug | INTRAMUSCULAR | Status: DC | PRN

## 2024-05-30 MED ORDER — PHENYLEPHRINE HCL (PRESSORS) 10 MG/ML IV SOLN
INTRAVENOUS | Status: DC | PRN
  Administered 2024-05-30 (×4): 80 ug via INTRAVENOUS

## 2024-05-30 MED ORDER — LIDOCAINE HCL (CARDIAC) PF 100 MG/5ML IV SOSY
PREFILLED_SYRINGE | INTRAVENOUS | Status: DC | PRN
  Administered 2024-05-30: 100 mg via INTRAVENOUS

## 2024-05-30 MED ORDER — OXYCODONE HCL 5 MG PO TABS: 5.0000 mg | ORAL_TABLET | Freq: Once | ORAL | Status: DC | PRN

## 2024-05-30 MED ORDER — ONDANSETRON HCL 4 MG/2ML IJ SOLN
INTRAMUSCULAR | Status: DC | PRN
  Administered 2024-05-30: 4 mg via INTRAVENOUS

## 2024-05-30 MED ORDER — EPHEDRINE SULFATE (PRESSORS) 25 MG/5ML IV SOSY
PREFILLED_SYRINGE | INTRAVENOUS | Status: DC | PRN
  Administered 2024-05-30 (×2): 10 mg via INTRAVENOUS
  Administered 2024-05-30: 5 mg via INTRAVENOUS

## 2024-05-30 MED ORDER — VANCOMYCIN HCL 500 MG IV SOLR
INTRAVENOUS | Status: DC | PRN
  Administered 2024-05-30: 500 mg via TOPICAL

## 2024-05-30 MED ORDER — FENTANYL CITRATE (PF) 100 MCG/2ML IJ SOLN
INTRAMUSCULAR | Status: DC | PRN
  Administered 2024-05-30: 50 ug via INTRAVENOUS

## 2024-05-30 MED ORDER — FENTANYL CITRATE (PF) 100 MCG/2ML IJ SOLN
100.0000 ug | Freq: Once | INTRAMUSCULAR | Status: AC
  Administered 2024-05-30: 100 ug via INTRAVENOUS

## 2024-05-30 MED ORDER — MIDAZOLAM HCL 2 MG/2ML IJ SOLN
INTRAMUSCULAR | Status: AC
  Filled 2024-05-30: qty 2

## 2024-05-30 MED ORDER — 0.9 % SODIUM CHLORIDE (POUR BTL) OPTIME
TOPICAL | Status: DC | PRN
  Administered 2024-05-30: 200 mL

## 2024-05-30 MED ORDER — FENTANYL CITRATE (PF) 100 MCG/2ML IJ SOLN
INTRAMUSCULAR | Status: AC
  Filled 2024-05-30: qty 2

## 2024-05-30 MED ORDER — LACTATED RINGERS IV SOLN: INTRAVENOUS | Status: DC

## 2024-05-30 MED ORDER — VANCOMYCIN HCL 500 MG IV SOLR
INTRAVENOUS | Status: AC
  Filled 2024-05-30: qty 10

## 2024-05-30 MED ORDER — OXYCODONE HCL 5 MG/5ML PO SOLN: 5.0000 mg | Freq: Once | ORAL | Status: DC | PRN

## 2024-05-30 MED ORDER — BUPIVACAINE HCL (PF) 0.5 % IJ SOLN
INTRAMUSCULAR | Status: DC | PRN
  Administered 2024-05-30: 15 mL via PERINEURAL

## 2024-05-30 MED ORDER — PROPOFOL 10 MG/ML IV BOLUS
INTRAVENOUS | Status: DC | PRN
  Administered 2024-05-30: 180 mg via INTRAVENOUS

## 2024-05-30 MED ORDER — CEFAZOLIN SODIUM-DEXTROSE 2-3 GM-%(50ML) IV SOLR
INTRAVENOUS | Status: DC | PRN
  Administered 2024-05-30: 2 g via INTRAVENOUS

## 2024-05-30 MED ORDER — RIVAROXABAN 10 MG PO TABS: 10.0000 mg | ORAL_TABLET | Freq: Every day | ORAL | 0 refills | Status: AC

## 2024-05-30 MED ORDER — OXYCODONE HCL 5 MG PO TABS: 5.0000 mg | ORAL_TABLET | Freq: Four times a day (QID) | ORAL | 0 refills | Status: AC | PRN

## 2024-05-30 MED ORDER — DEXAMETHASONE SOD PHOSPHATE PF 10 MG/ML IJ SOLN
INTRAMUSCULAR | Status: DC | PRN
  Administered 2024-05-30: 10 mg via INTRAVENOUS

## 2024-05-30 MED ORDER — MIDAZOLAM HCL (PF) 2 MG/2ML IJ SOLN
2.0000 mg | Freq: Once | INTRAMUSCULAR | Status: AC
  Administered 2024-05-30: 2 mg via INTRAVENOUS

## 2024-05-30 MED ORDER — ONDANSETRON HCL 4 MG/2ML IJ SOLN
INTRAMUSCULAR | Status: AC
  Filled 2024-05-30: qty 2

## 2024-05-30 MED ORDER — CEFAZOLIN SODIUM-DEXTROSE 2-4 GM/100ML-% IV SOLN
INTRAVENOUS | Status: AC
  Filled 2024-05-30: qty 100

## 2024-05-30 NOTE — Progress Notes (Signed)
Assisted Dr. Houser with left, popliteal, ultrasound guided block. Side rails up, monitors on throughout procedure. See vital signs in flow sheet. Tolerated Procedure well. 

## 2024-05-30 NOTE — Anesthesia Procedure Notes (Signed)
 Procedure Name: LMA Insertion Date/Time: 05/30/2024 12:46 PM  Performed by: Julieanne Fairy BROCKS, CRNAPre-anesthesia Checklist: Patient identified, Emergency Drugs available, Suction available and Patient being monitored Patient Re-evaluated:Patient Re-evaluated prior to induction Oxygen Delivery Method: Circle system utilized Preoxygenation: Pre-oxygenation with 100% oxygen Induction Type: IV induction Ventilation: Mask ventilation without difficulty LMA: LMA inserted LMA Size: 5.0 Number of attempts: 1 Airway Equipment and Method: Bite block Placement Confirmation: positive ETCO2 Tube secured with: Tape Dental Injury: Teeth and Oropharynx as per pre-operative assessment

## 2024-05-30 NOTE — Op Note (Signed)
 05/30/2024  1:46 PM  PATIENT:  Maurice Swanson  64 y.o. male  PRE-OPERATIVE DIAGNOSIS:  1.  Left peroneus brevis tendon tear      2.  Left peroneus longus tenosynovitis  POST-OPERATIVE DIAGNOSIS:  same  Procedures:  Left peroneus longus tenolysis Left peroneus brevis tendon repair  SURGEON:  Norleen Armor, MD  ASSISTANT: Dickey Sales, PA-C  ANESTHESIA:   General, regional  EBL:  minimal   TOURNIQUET:   Total Tourniquet Time Documented: Thigh (Left) - 32 minutes Total: Thigh (Left) - 32 minutes  COMPLICATIONS:  None apparent  DISPOSITION:  Extubated, awake and stable to recovery.  INDICATION FOR PROCEDURE: 64 year old male with past medical history significant for smoking complains of left ankle pain.  He has failed nonoperative treatment including activity modification, bracing and physical therapy.  An MRI reveals a peroneus brevis tendon tear as well as peroneus longus tenosynovitis.  He presents now for surgical treatment.  After pre operative consent was obtained, and the correct operative site was identified, the patient was brought to the operating room and placed supine on the OR table.  Anesthesia was administered.  Pre-operative antibiotics were administered.  A surgical timeout was taken.   PROCEDURE IN DETAIL:  After pre operative consent was obtained, and the correct operative site was identified, the patient was brought to the operating room and placed supine on the OR table.  Anesthesia was administered.  Pre-operative antibiotics were administered.  A surgical timeout was taken.  The left lower extremity was prepped and draped in standard sterile fashion with a tourniquet around the thigh.  The extremity was elevated, and the tourniquet was inflated to 250 mmHg.  An incision was made from the fifth metatarsal base to the retrofibular groove.  Dissection was carried sharply down through the subcutaneous tissues.  Care was taken to protect branches of the sural nerve.   The peroneus brevis tendon was identified.  The tendon sheath was incised and elevated proximally and distally.  The brevis was noted to have a longitudinal split tear starting a few centimeters distal from the tip of the fibula and extending into the retrofibular groove.  The superior peroneal retinaculum was then taken down in subperiosteal fashion.  The peroneus longus was then examined.  It was noted to have significant tenosynovitis and a superficial partial-thickness tear.  Both the tenosynovitis and tendon tear were debrided with scissors leaving healthy tendon.  The low-lying muscle fibers of the brevis tendon were then debrided.  The split tear of the brevis was then debrided on both sides leaving healthy tendon.  The tendon was then repaired with horizontal mattress sutures in the mid substance of the tear followed by running 0 Vicryl at the leading and trailing edges of the tendon.  The wound was then irrigated copiously.  Both tendons were reduced into the retrofibular groove.  The SPR was repaired with imbricating sutures of 0 Vicryl.  The incision was sprinkled with vancomycin  powder.  Subcutaneous tissues were approximated with inverted simple sutures of 3-0 Monocryl.  The skin incision was closed with running 3-0 nylon.  Sterile dressings were applied followed by a well-padded short leg splint.  The tourniquet was released after application of the dressings.  The patient was awakened from anesthesia and transported to the recovery room in stable condition.   FOLLOW UP PLAN: Nonweightbearing on the left lower extremity.  Xarelto  for DVT prophylaxis.  Follow-up in 2 weeks for suture removal and conversion to a tall cam boot.  He will initiate active gentle plantarflexion and dorsiflexion range of motion.    Dickey Sales, PA-C was present and scrubbed for the duration of the operative case. His assistance was essential in positioning the patient, prepping and draping, gaining and maintaining  exposure, performing the operation, closing and dressing the wounds and applying the splint.

## 2024-05-30 NOTE — Discharge Instructions (Addendum)
 Norleen Armor, MD [facility]  Please read the following information regarding your care after surgery.  Medications  You only need a prescription for the narcotic pain medicine (ex. oxycodone , Percocet, Norco).  All of the other medicines listed below are available over the counter. X Aleve 2 pills twice a day for the first 3 days after surgery. X acetominophen (Tylenol ) 650 mg every 4-6 hours as you need for minor to moderate pain X oxycodone  as prescribed for severe pain  Narcotic pain medicine (ex. oxycodone , Percocet, Vicodin) will cause constipation.  To prevent this problem, take the following medicines while you are taking any pain medicine. X docusate sodium (Colace) 100 mg twice a day X senna (Senokot) 2 tablets twice a day  X To help prevent blood clots, take Xarelto  daily for two weeks after surgery.  You should also get up every hour while you are awake to move around.    Weight Bearing X Do not bear any weight on the operated leg or foot.  Cast / Splint / Dressing X Keep your splint, cast or dressing clean and dry.  Don't put anything (coat hanger, pencil, etc) down inside of it.  If it gets damp, use a hair dryer on the cool setting to dry it.  If it gets soaked, call the office to schedule an appointment for a cast change.  After your dressing, cast or splint is removed; you may shower, but do not soak or scrub the wound.  Allow the water to run over it, and then gently pat it dry.  Swelling It is normal for you to have swelling where you had surgery.  To reduce swelling and pain, keep your toes above your nose for at least 3 days after surgery.  It may be necessary to keep your foot or leg elevated for several weeks.  If it hurts, it should be elevated.  Follow Up Call my office at [phone] when you are discharged from the hospital or surgery center to schedule an appointment to be seen two weeks after surgery.  Call my office at [phone] if you develop a fever  >101.5 F, nausea, vomiting, bleeding from the surgical site or severe pain.      Post Anesthesia Home Care Instructions  Activity: Get plenty of rest for the remainder of the day. A responsible individual must stay with you for 24 hours following the procedure.  For the next 24 hours, DO NOT: -Drive a car -Advertising copywriter -Drink alcoholic beverages -Take any medication unless instructed by your physician -Make any legal decisions or sign important papers.  Meals: Start with liquid foods such as gelatin or soup. Progress to regular foods as tolerated. Avoid greasy, spicy, heavy foods. If nausea and/or vomiting occur, drink only clear liquids until the nausea and/or vomiting subsides. Call your physician if vomiting continues.  Special Instructions/Symptoms: Your throat may feel dry or sore from the anesthesia or the breathing tube placed in your throat during surgery. If this causes discomfort, gargle with warm salt water. The discomfort should disappear within 24 hours.  If you had a scopolamine patch placed behind your ear for the management of post- operative nausea and/or vomiting:  1. The medication in the patch is effective for 72 hours, after which it should be removed.  Wrap patch in a tissue and discard in the trash. Wash hands thoroughly with soap and water. 2. You may remove the patch earlier than 72 hours if you experience unpleasant side effects which may include  dry mouth, dizziness or visual disturbances. 3. Avoid touching the patch. Wash your hands with soap and water after contact with the patch.   Regional Anesthesia Blocks  1. You may not be able to move or feel the blocked extremity after a regional anesthetic block. This may last may last from 3-48 hours after placement, but it will go away. The length of time depends on the medication injected and your individual response to the medication. As the nerves start to wake up, you may experience tingling as the  movement and feeling returns to your extremity. If the numbness and inability to move your extremity has not gone away after 48 hours, please call your surgeon.   2. The extremity that is blocked will need to be protected until the numbness is gone and the strength has returned. Because you cannot feel it, you will need to take extra care to avoid injury. Because it may be weak, you may have difficulty moving it or using it. You may not know what position it is in without looking at it while the block is in effect.  3. For blocks in the legs and feet, returning to weight bearing and walking needs to be done carefully. You will need to wait until the numbness is entirely gone and the strength has returned. You should be able to move your leg and foot normally before you try and bear weight or walk. You will need someone to be with you when you first try to ensure you do not fall and possibly risk injury.  4. Bruising and tenderness at the needle site are common side effects and will resolve in a few days.  5. Persistent numbness or new problems with movement should be communicated to the surgeon or the [facility] [phone] [facility] [phone]).Information for Discharge Teaching: EXPAREL  (bupivacaine  liposome injectable suspension)   Pain relief is important to your recovery. The goal is to control your pain so you can move easier and return to your normal activities as soon as possible after your procedure. Your physician may use several types of medicines to manage pain, swelling, and more.  Your surgeon or anesthesiologist gave you EXPAREL (bupivacaine ) to help control your pain after surgery.  EXPAREL  is a local anesthetic designed to release slowly over an extended period of time to provide pain relief by numbing the tissue around the surgical site. EXPAREL  is designed to release pain medication over time and can control pain for up to 72 hours. Depending on  how you respond to EXPAREL , you may require less pain medication during your recovery. EXPAREL  can help reduce or eliminate the need for opioids during the first few days after surgery when pain relief is needed the most. EXPAREL  is not an opioid and is not addictive. It does not cause sleepiness or sedation.   Important! A teal colored band has been placed on your arm with the date, time and amount of EXPAREL  you have received. Please leave this armband in place for the full 96 hours following administration, and then you may remove the band. If you return to the hospital for any reason within 96 hours following the administration of EXPAREL , the armband provides important information that your health care providers to know, and alerts them that you have received this anesthetic.    Possible side effects of EXPAREL : Temporary loss of sensation or ability to move in the area where medication was injected. Nausea, vomiting, constipation Rarely, numbness and tingling  in your mouth or lips, lightheadedness, or anxiety may occur. Call your doctor right away if you think you may be experiencing any of these sensations, or if you have other questions regarding possible side effects.  Follow all other discharge instructions given to you by your surgeon or nurse. Eat a healthy diet and drink plenty of water or other fluids.

## 2024-05-30 NOTE — Anesthesia Procedure Notes (Signed)
 Anesthesia Regional Block: Popliteal block   Pre-Anesthetic Checklist: , timeout performed,  Correct Patient, Correct Site, Correct Laterality,  Correct Procedure, Correct Position, site marked,  Risks and benefits discussed,  Pre-op evaluation,  At surgeon's request and post-op pain management  Laterality: Lower and Left  Prep: Maximum Sterile Barrier Precautions used, chloraprep       Needles:  Injection technique: Single-shot  Needle Type: Echogenic Needle     Needle Length: 9cm  Needle Gauge: 21     Additional Needles:   Procedures:,,,, ultrasound used (permanent image in chart),,    Narrative:  Start time: 05/30/2024 12:11 PM End time: 05/30/2024 12:16 PM Injection made incrementally with aspirations every 5 mL.  Performed by: Personally  Anesthesiologist: Jefm Garnette LABOR, MD  Additional Notes: Block assessed. Patient tolerated procedure well.

## 2024-05-30 NOTE — Transfer of Care (Signed)
 Immediate Anesthesia Transfer of Care Note  Patient: Maurice Swanson  Procedure(s) Performed: Left Peroneus brevis tendon repair; peroneus longus tenolysis (Left: Ankle)  Patient Location: PACU  Anesthesia Type:General and Regional  Level of Consciousness: sedated  Airway & Oxygen Therapy: Patient Spontanous Breathing and Patient connected to nasal cannula oxygen  Post-op Assessment: Report given to RN and Post -op Vital signs reviewed and stable  Post vital signs: Reviewed and stable  Last Vitals:  Vitals Value Taken Time  BP 117/78 05/30/24 13:44  Temp    Pulse 54 05/30/24 13:45  Resp 10 05/30/24 13:45  SpO2 97 % 05/30/24 13:45  Vitals shown include unfiled device data.  Last Pain:  Vitals:   05/30/24 1015  PainSc: 0-No pain      Patients Stated Pain Goal: 4 (05/30/24 1015)  Complications: No notable events documented.

## 2024-05-30 NOTE — H&P (Signed)
 Maurice Swanson is an 64 y.o. male.   Chief Complaint: Left ankle pain HPI: 64 year old male with worsening left ankle pain.  He has failed nonoperative treatment including activity modification and bracing.  An MRI reveals a peroneus brevis tendon tear and peroneus longus tenosynovitis.  He presents now for repair of the brevis tear and tenolysis of the peroneus longus.  Past Medical History:  Diagnosis Date   Arthritis    Humerus fracture    left   Wears partial dentures     Past Surgical History:  Procedure Laterality Date   HIP SURGERY     INCISION AND DRAINAGE ABSCESS N/A 11/10/2018   Procedure: INCISION AND DRAINAGE NECK ABSCESS;  Surgeon: Carlie Clark, MD;  Location: [facility] OR;  Service: ENT;  Laterality: N/A;   MULTIPLE TOOTH EXTRACTIONS     SHOULDER CLOSED REDUCTION Left 10/02/2018   Procedure: manipulation of shoulder and elbow;  Surgeon: Sharl Selinda Dover, MD;  Location: [facility];  Service: Orthopedics;  Laterality: Left;    Family History  Problem Relation Age of Onset   Hypertension Mother    Social History:  reports that he has been smoking cigarettes. He has a 17.5 pack-year smoking history. He has never used smokeless tobacco. He reports that he does not drink alcohol and does not use drugs.  Allergies: Allergies[1]  No medications prior to admission.    No results found for this or any previous visit (from the past 48 hours). No results found.  Review of Systems no recent fever, chills, nausea, vomiting or changes in his appetite  Height 5' 11 (1.803 m), weight 77.1 kg. Physical Exam  Well-nourished well-developed man in no apparent distress.  Alert and oriented.  Normal mood and affect.  Gait is antalgic to the left.  Left ankle is tender and swollen along the peroneals.  5 out of 5 strength in the eversion but this recreates his pain.  Skin is healthy otherwise.  Intact sensibility to light touch in the sural nerve distribution.  No lymphadenopathy.  Pulses are  palpable in the foot.   Assessment/Plan Left peroneus brevis tendon tear and peroneus longus tenosynovitis -to the operating room today for surgical treatment.  The risks and benefits of the alternative treatment options have been discussed in detail.  The patient wishes to proceed with surgery and specifically understands risks of bleeding, infection, nerve damage, blood clots, need for additional surgery, amputation and death.   Norleen Armor, MD 06/25/2024, 9:09 AM       [1] No Known Allergies

## 2024-05-31 ENCOUNTER — Encounter (HOSPITAL_BASED_OUTPATIENT_CLINIC_OR_DEPARTMENT_OTHER): Payer: Self-pay | Admitting: Orthopedic Surgery

## 2024-06-03 NOTE — Anesthesia Postprocedure Evaluation (Signed)
"   Anesthesia Post Note  Patient: Maurice Swanson  Procedure(s) Performed: Left Peroneus brevis tendon repair; peroneus longus tenolysis (Left: Ankle)     Patient location during evaluation: PACU Anesthesia Type: Regional and General Level of consciousness: awake and alert Pain management: pain level controlled Vital Signs Assessment: post-procedure vital signs reviewed and stable Respiratory status: spontaneous breathing, nonlabored ventilation and respiratory function stable Cardiovascular status: blood pressure returned to baseline and stable Postop Assessment: no apparent nausea or vomiting Anesthetic complications: no   No notable events documented.  Last Vitals:  Vitals:   05/30/24 1345 05/30/24 1425  BP: 120/74 (!) 141/95  Pulse: (!) 54 65  Resp: 10 18  Temp:  (!) 36.3 C  SpO2: 97% 94%    Last Pain:  Vitals:   05/30/24 1425  PainSc: 0-No pain                 Nadia Viar      "
